# Patient Record
Sex: Female | Born: 1970
Health system: Southern US, Community
[De-identification: ages and names within clinical notes are randomized; demographics above are authoritative.]

## PROBLEM LIST (undated history)

## (undated) DIAGNOSIS — M62838 Other muscle spasm: Secondary | ICD-10-CM

## (undated) DIAGNOSIS — R479 Unspecified speech disturbances: Secondary | ICD-10-CM

## (undated) DIAGNOSIS — G4700 Insomnia, unspecified: Secondary | ICD-10-CM

## (undated) DIAGNOSIS — J45909 Unspecified asthma, uncomplicated: Secondary | ICD-10-CM

## (undated) DIAGNOSIS — I10 Essential (primary) hypertension: Secondary | ICD-10-CM

## (undated) DIAGNOSIS — R7303 Prediabetes: Secondary | ICD-10-CM

## (undated) DIAGNOSIS — F419 Anxiety disorder, unspecified: Secondary | ICD-10-CM

## (undated) HISTORY — DX: Other muscle spasm: M62.838

## (undated) HISTORY — DX: Insomnia, unspecified: G47.00

## (undated) HISTORY — DX: Prediabetes: R73.03

## (undated) HISTORY — DX: Unspecified speech disturbances: R47.9

## (undated) HISTORY — DX: Anxiety disorder, unspecified: F41.9

## (undated) HISTORY — PX: TUBAL LIGATION: SHX77

---

## 1998-02-08 ENCOUNTER — Emergency Department (HOSPITAL_COMMUNITY): Admission: EM | Admit: 1998-02-08 | Discharge: 1998-02-08 | Payer: Self-pay | Admitting: Emergency Medicine

## 1998-02-14 ENCOUNTER — Emergency Department (HOSPITAL_COMMUNITY): Admission: EM | Admit: 1998-02-14 | Discharge: 1998-02-14 | Payer: Self-pay | Admitting: Emergency Medicine

## 1998-02-14 ENCOUNTER — Encounter: Payer: Self-pay | Admitting: Emergency Medicine

## 2001-11-04 ENCOUNTER — Other Ambulatory Visit: Admission: RE | Admit: 2001-11-04 | Discharge: 2001-11-04 | Payer: Self-pay | Admitting: Family Medicine

## 2004-08-16 ENCOUNTER — Emergency Department (HOSPITAL_COMMUNITY): Admission: EM | Admit: 2004-08-16 | Discharge: 2004-08-16 | Payer: Self-pay | Admitting: *Deleted

## 2004-09-04 ENCOUNTER — Encounter: Admission: RE | Admit: 2004-09-04 | Discharge: 2004-09-27 | Payer: Self-pay | Admitting: Sports Medicine

## 2005-04-07 ENCOUNTER — Emergency Department (HOSPITAL_COMMUNITY): Admission: EM | Admit: 2005-04-07 | Discharge: 2005-04-08 | Payer: Self-pay | Admitting: Emergency Medicine

## 2008-08-28 ENCOUNTER — Emergency Department (HOSPITAL_COMMUNITY): Admission: EM | Admit: 2008-08-28 | Discharge: 2008-08-28 | Payer: Self-pay | Admitting: Emergency Medicine

## 2009-02-03 ENCOUNTER — Emergency Department (HOSPITAL_COMMUNITY): Admission: EM | Admit: 2009-02-03 | Discharge: 2009-02-03 | Payer: Self-pay | Admitting: Emergency Medicine

## 2010-07-27 LAB — PREGNANCY, URINE: Preg Test, Ur: NEGATIVE

## 2010-12-20 ENCOUNTER — Emergency Department (HOSPITAL_COMMUNITY)
Admission: EM | Admit: 2010-12-20 | Discharge: 2010-12-21 | Disposition: A | Discharge status: Home / Self Care | Payer: Self-pay | Attending: Emergency Medicine | Admitting: Emergency Medicine

## 2010-12-20 DIAGNOSIS — R51 Headache: Secondary | ICD-10-CM | POA: Insufficient documentation

## 2010-12-20 DIAGNOSIS — F411 Generalized anxiety disorder: Secondary | ICD-10-CM | POA: Insufficient documentation

## 2010-12-20 DIAGNOSIS — R42 Dizziness and giddiness: Secondary | ICD-10-CM | POA: Insufficient documentation

## 2010-12-20 LAB — URINALYSIS, ROUTINE W REFLEX MICROSCOPIC
Glucose, UA: NEGATIVE mg/dL
Hgb urine dipstick: NEGATIVE
Ketones, ur: NEGATIVE mg/dL
Nitrite: NEGATIVE
Specific Gravity, Urine: 1.033 — ABNORMAL HIGH (ref 1.005–1.030)
pH: 6.5 (ref 5.0–8.0)

## 2010-12-20 LAB — POCT PREGNANCY, URINE: Preg Test, Ur: NEGATIVE

## 2011-11-07 ENCOUNTER — Encounter (HOSPITAL_COMMUNITY): Payer: Self-pay | Admitting: *Deleted

## 2011-11-07 ENCOUNTER — Emergency Department (HOSPITAL_COMMUNITY)
Admission: EM | Admit: 2011-11-07 | Discharge: 2011-11-07 | Discharge status: Against Medical Advice | Payer: Self-pay | Attending: Emergency Medicine | Admitting: Emergency Medicine

## 2011-11-07 DIAGNOSIS — B353 Tinea pedis: Secondary | ICD-10-CM

## 2011-11-07 DIAGNOSIS — M79609 Pain in unspecified limb: Secondary | ICD-10-CM | POA: Insufficient documentation

## 2011-11-07 HISTORY — DX: Unspecified asthma, uncomplicated: J45.909

## 2011-11-07 NOTE — ED Notes (Signed)
Reports possible spider bite to right foot, between 4th and 5th toes. No acute distress noted at triage.

## 2011-11-07 NOTE — ED Provider Notes (Signed)
History   This chart was scribed for Rebecca Shi, MD by Sofie Rower. The patient was seen in room TR02C/TR02C and the patient's care was started at 4:31 PM       CSN: [id number]  Arrival date & time 11/07/11  1603   First MD Initiated Contact with Patient 11/07/11 1618      Chief Complaint  Patient presents with  . Foot Injury    (Consider location/radiation/quality/duration/timing/severity/associated sxs/prior treatment) Patient is a 41 y.o. female presenting with foot injury. The history is provided by the patient. No language interpreter was used.  Foot Injury  The incident occurred 3 to 5 hours ago. The incident occurred at home. The injury mechanism is unknown. The pain is present in the right foot. The quality of the pain is described as aching. The pain is mild. The pain has been constant since onset. Pertinent negatives include no numbness, no inability to bear weight and no loss of sensation. She reports no foreign bodies present. Nothing aggravates the symptoms. Treatments tried: allergy medication.  The treatment provided no relief.    Rebecca Stevenson is a 41 y.o. female who presents to the Emergency Department complaining of moderate, episodic foot injury located at the right foot between the 4th and 5th toes onset today with associated symptoms of swelling, soreness, restlessness, dizziness, abdominal cramping. Modifying factors include taking over the counter allergy medication which does not provide relief, soaking feet in epsom salt which provides moderate relief. Pt has a hx of asthma.   Pt denies itching, diabetes, anemia, chance of pregnancy.   PCP is Dr. Daphine Deutscher.    Past Medical History  Diagnosis Date  . Asthma     History reviewed. No pertinent past surgical history.  History reviewed. No pertinent family history.  History  Substance Use Topics  . Smoking status: Not on file  . Smokeless tobacco: Not on file  . Alcohol Use: No    OB History      Grav Para Term Preterm Abortions TAB SAB Ect Mult Living                  Review of Systems  Neurological: Negative for numbness.  All other systems reviewed and are negative.    10 Systems reviewed and all are negative for acute change except as noted in the HPI.    Allergies  Review of patient's allergies indicates no known allergies.  Home Medications   Current Outpatient Rx  Name Route Sig Dispense Refill  . ALBUTEROL SULFATE HFA 108 (90 BASE) MCG/ACT IN AERS Inhalation Inhale 2 puffs into the lungs every 6 (six) hours as needed. For asthma symptoms    . DIPHENHYDRAMINE HCL 25 MG PO CAPS Oral Take 25 mg by mouth daily as needed. For allergies      BP 153/91  Pulse 73  Temp 98.7 F (37.1 C) (Oral)  Resp 16  SpO2 100%  LMP 11/07/2011  Physical Exam  Nursing note and vitals reviewed. Constitutional: She is oriented to person, place, and time. She appears well-developed and well-nourished. No distress.  HENT:  Head: Normocephalic and atraumatic.  Nose: Nose normal.  Eyes: Pupils are equal, round, and reactive to light.  Neck: Normal range of motion.  Cardiovascular: Normal rate and intact distal pulses.         Date: 11/07/2011  Rate: 70  Rhythm: normal sinus rhythm  QRS Axis: normal  Intervals: normal  ST/T Wave abnormalities: normal  Conduction Disutrbances: none  Narrative Interpretation: unremarkable      Pulmonary/Chest: No respiratory distress.  Abdominal: Normal appearance. She exhibits no distension.  Musculoskeletal: Normal range of motion.       Feet:  Neurological: She is alert and oriented to person, place, and time. No cranial nerve deficit.  Skin: Skin is warm and dry. No rash noted.  Psychiatric: She has a normal mood and affect. Her behavior is normal.    ED Course  Procedures (including critical care time)  DIAGNOSTIC STUDIES: Oxygen Saturation is 100% on room air, normal by my interpretation.    COORDINATION OF  CARE:    4:35PM- EDP at bedside discusses treatment plan concerning breaking of the skin, creation of bacterial infection. EDP discusses application of fungal medication, antibiotics, and dressing the fungal area for 2-3 weeks.   4:40PM- Patient inquires with EDP about possible examination of her dizziness. EDP discusses with pt the possibility of blood work and pregnancy test.   Labs Reviewed - No data to display No results found.   1. Athlete's foot       MDM         I personally performed the services described in this documentation, which was scribed in my presence. The recorded information has been reviewed and considered.    Rebecca Shi, MD 11/14/11 2118

## 2011-11-07 NOTE — ED Notes (Signed)
In to assess; unable to locate pt - positive gown sign - MD aware of same

## 2011-12-05 ENCOUNTER — Emergency Department (HOSPITAL_COMMUNITY): Payer: Self-pay

## 2011-12-05 ENCOUNTER — Encounter (HOSPITAL_COMMUNITY): Payer: Self-pay | Admitting: Emergency Medicine

## 2011-12-05 ENCOUNTER — Emergency Department (HOSPITAL_COMMUNITY)
Admission: EM | Admit: 2011-12-05 | Discharge: 2011-12-05 | Disposition: A | Discharge status: Home / Self Care | Payer: No Typology Code available for payment source | Attending: Emergency Medicine | Admitting: Emergency Medicine

## 2011-12-05 DIAGNOSIS — M25559 Pain in unspecified hip: Secondary | ICD-10-CM

## 2011-12-05 DIAGNOSIS — Z87891 Personal history of nicotine dependence: Secondary | ICD-10-CM | POA: Insufficient documentation

## 2011-12-05 DIAGNOSIS — Y998 Other external cause status: Secondary | ICD-10-CM | POA: Insufficient documentation

## 2011-12-05 DIAGNOSIS — J45909 Unspecified asthma, uncomplicated: Secondary | ICD-10-CM | POA: Insufficient documentation

## 2011-12-05 DIAGNOSIS — S39012A Strain of muscle, fascia and tendon of lower back, initial encounter: Secondary | ICD-10-CM

## 2011-12-05 DIAGNOSIS — S161XXA Strain of muscle, fascia and tendon at neck level, initial encounter: Secondary | ICD-10-CM

## 2011-12-05 DIAGNOSIS — S139XXA Sprain of joints and ligaments of unspecified parts of neck, initial encounter: Secondary | ICD-10-CM | POA: Insufficient documentation

## 2011-12-05 DIAGNOSIS — S339XXA Sprain of unspecified parts of lumbar spine and pelvis, initial encounter: Secondary | ICD-10-CM | POA: Insufficient documentation

## 2011-12-05 DIAGNOSIS — Y93I9 Activity, other involving external motion: Secondary | ICD-10-CM | POA: Insufficient documentation

## 2011-12-05 LAB — URINALYSIS, ROUTINE W REFLEX MICROSCOPIC
Bilirubin Urine: NEGATIVE
Ketones, ur: NEGATIVE mg/dL
Leukocytes, UA: NEGATIVE
Nitrite: NEGATIVE
Protein, ur: NEGATIVE mg/dL
Specific Gravity, Urine: 1.009 (ref 1.005–1.030)

## 2011-12-05 LAB — URINE MICROSCOPIC-ADD ON

## 2011-12-05 LAB — PREGNANCY, URINE: Preg Test, Ur: NEGATIVE

## 2011-12-05 MED ORDER — OXYCODONE-ACETAMINOPHEN 5-325 MG PO TABS
1.0000 | ORAL_TABLET | Freq: Once | ORAL | Status: AC
  Administered 2011-12-05: 1 via ORAL
  Filled 2011-12-05: qty 1

## 2011-12-05 MED ORDER — IBUPROFEN 600 MG PO TABS: 600.0000 mg | ORAL_TABLET | Freq: Four times a day (QID) | ORAL | Status: AC | PRN
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
## 2012-08-25 ENCOUNTER — Encounter (HOSPITAL_COMMUNITY): Payer: Self-pay

## 2012-08-25 ENCOUNTER — Emergency Department (INDEPENDENT_AMBULATORY_CARE_PROVIDER_SITE_OTHER)
Admission: EM | Admit: 2012-08-25 | Discharge: 2012-08-25 | Disposition: A | Discharge status: Home / Self Care | Payer: Self-pay | Source: Home / Self Care | Attending: Emergency Medicine | Admitting: Emergency Medicine

## 2012-08-25 DIAGNOSIS — B86 Scabies: Secondary | ICD-10-CM

## 2012-08-25 DIAGNOSIS — J45909 Unspecified asthma, uncomplicated: Secondary | ICD-10-CM

## 2012-08-25 DIAGNOSIS — K529 Noninfective gastroenteritis and colitis, unspecified: Secondary | ICD-10-CM

## 2012-08-25 DIAGNOSIS — K5289 Other specified noninfective gastroenteritis and colitis: Secondary | ICD-10-CM

## 2012-08-25 LAB — POCT PREGNANCY, URINE: Preg Test, Ur: NEGATIVE

## 2012-08-25 MED ORDER — ONDANSETRON 8 MG PO TBDP: 8.0000 mg | ORAL_TABLET | Freq: Three times a day (TID) | ORAL | Status: DC | PRN

## 2012-08-25 MED ORDER — ONDANSETRON 4 MG PO TBDP
ORAL_TABLET | ORAL | Status: AC
  Filled 2012-08-25: qty 2

## 2012-08-25 MED ORDER — ALBUTEROL SULFATE HFA 108 (90 BASE) MCG/ACT IN AERS: 1.0000 | INHALATION_SPRAY | Freq: Four times a day (QID) | RESPIRATORY_TRACT | Status: DC | PRN

## 2012-08-25 MED ORDER — ONDANSETRON 4 MG PO TBDP
8.0000 mg | ORAL_TABLET | Freq: Once | ORAL | Status: AC
  Administered 2012-08-25: 8 mg via ORAL

## 2012-08-25 MED ORDER — IVERMECTIN 3 MG PO TABS: ORAL_TABLET | ORAL | Status: DC

## 2012-08-25 MED ORDER — DIPHENOXYLATE-ATROPINE 2.5-0.025 MG PO TABS: 1.0000 | ORAL_TABLET | Freq: Four times a day (QID) | ORAL | Status: DC | PRN

## 2012-08-25 NOTE — ED Notes (Signed)
Nausea vomiting, diarrhea, chills since yesterday

## 2012-08-25 NOTE — ED Provider Notes (Signed)
Chief Complaint:   Chief Complaint  Patient presents with  . GI Problem    History of Present Illness:   Rebecca Stevenson is a 42 year old female who has a history since last night of nausea, vomiting, and diarrhea. She's vomited about 6 times. There was a small speck of blood in the last vomitus. She's had about 3 diarrheal stools without blood or melena. She's been exposed to coworkers at work with similar symptoms. She had low-grade fever of 99.2 and felt chilled. She had some mild, periumbilical abdominal pain and cramping. No foreign travel or suspicious ingestions.  She also wanted something for treatment for scabies. She prefers a pill. She's been exposed to her daughter. Her daughter has already been treated she's had a few small itchy bumps on her arms.  She also wanted a refill on albuterol inhaler for asthma.  She also has thickened, mycotic nails.  Review of Systems:  Other than noted above, the patient denies any of the following symptoms: Systemic:  No fevers, chills, sweats, weight loss or gain, fatigue, or tiredness. ENT:  No nasal congestion, rhinorrhea, or sore throat. Lungs:  No cough, wheezing, or shortness of breath. Cardiac:  No chest pain, syncope, or presyncope. GI:  No abdominal pain, nausea, vomiting, anorexia, diarrhea, constipation, blood in stool or vomitus. GU:  No dysuria, frequency, or urgency.  PMFSH:  Past medical history, family history, social history, meds, and allergies were reviewed.  She has asthma but does not have an inhaler.   Physical Exam:   Vital signs:  BP 109/72  Pulse 70  Temp(Src) 98.5 F (36.9 C) (Oral)  Resp 20  SpO2 99% General:  Alert and oriented.  In no distress.  Skin warm and dry.  Good skin turgor, brisk capillary refill. ENT:  No scleral icterus, moist mucous membranes, no oral lesions, pharynx clear. Lungs:  Breath sounds clear and equal bilaterally.  No wheezes, rales, or rhonchi. Heart:  Rhythm regular, without  extrasystoles.  No gallops or murmers. Abdomen:   Soft and nontender, no organomegaly or mass, bowel sounds are hyperactive.  Skin: Clear, warm, and dry.  Good turgor.  Brisk capillary refill.  Labs:   Results for orders placed during the hospital encounter of 08/25/12  POCT PREGNANCY, URINE      Result Value Range   Preg Test, Ur NEGATIVE  NEGATIVE     Course in Urgent Care Center:    Given Zofran ODT 8 mg sublingually.   Assessment:  The primary encounter diagnosis was Gastroenteritis. Diagnoses of Scabies and Asthma were also pertinent to this visit.  Plan:   1.  The following meds were prescribed:   Discharge Medication List as of 08/25/2012  2:37 PM    START taking these medications   Details  !! albuterol (PROVENTIL HFA;VENTOLIN HFA) 108 (90 BASE) MCG/ACT inhaler Inhale 1-2 puffs into the lungs every 6 (six) hours as needed for wheezing., Starting 08/25/2012, Until Discontinued, Normal    diphenoxylate-atropine (LOMOTIL) 2.5-0.025 MG per tablet Take 1 tablet by mouth 4 (four) times daily as needed for diarrhea or loose stools., Starting 08/25/2012, Until Discontinued, Print    ivermectin (STROMECTOL) 3 MG TABS Take 7 pills all at once., Normal    ondansetron (ZOFRAN ODT) 8 MG disintegrating tablet Take 1 tablet (8 mg total) by mouth every 8 (eight) hours as needed for nausea., Starting 08/25/2012, Until Discontinued, Normal     !! - Potential duplicate medications found. Please discuss with provider.  2.  The patient was instructed in symptomatic care and handouts were given. 3.  The patient was told to return if becoming worse in any way, if no better in 2 or 3 days, and given some red flag symptoms such as  persistent vomiting, pain, or fever  that would indicate earlier return. 4.  The patient was told to take only sips of clear liquids for the next 24 hours and then advance to a b.r.a.t. Diet. 5.  Follow up here if no better in 2 to 3 days.      Reuben Likes,  MD 08/25/12 2154
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
## 2013-09-26 ENCOUNTER — Encounter (HOSPITAL_COMMUNITY): Payer: Self-pay | Admitting: Emergency Medicine

## 2013-09-26 ENCOUNTER — Emergency Department (HOSPITAL_COMMUNITY)
Admission: EM | Admit: 2013-09-26 | Discharge: 2013-09-26 | Disposition: A | Discharge status: Home / Self Care | Payer: Self-pay | Source: Home / Self Care | Attending: Family Medicine | Admitting: Family Medicine

## 2013-09-26 DIAGNOSIS — S43499A Other sprain of unspecified shoulder joint, initial encounter: Secondary | ICD-10-CM

## 2013-09-26 DIAGNOSIS — S46819A Strain of other muscles, fascia and tendons at shoulder and upper arm level, unspecified arm, initial encounter: Secondary | ICD-10-CM

## 2013-09-26 DIAGNOSIS — X500XXA Overexertion from strenuous movement or load, initial encounter: Secondary | ICD-10-CM

## 2013-09-26 DIAGNOSIS — X503XXA Overexertion from repetitive movements, initial encounter: Secondary | ICD-10-CM

## 2013-09-26 MED ORDER — HYDROCODONE-ACETAMINOPHEN 5-325 MG PO TABS: 1.0000 | ORAL_TABLET | ORAL | Status: DC | PRN

## 2013-09-26 MED ORDER — CYCLOBENZAPRINE HCL 10 MG PO TABS: 10.0000 mg | ORAL_TABLET | Freq: Two times a day (BID) | ORAL | Status: DC | PRN

## 2013-09-26 MED ORDER — IBUPROFEN 800 MG PO TABS
ORAL_TABLET | ORAL | Status: AC
  Filled 2013-09-26: qty 1

## 2013-09-26 MED ORDER — TRAMADOL HCL 50 MG PO TABS: 50.0000 mg | ORAL_TABLET | Freq: Four times a day (QID) | ORAL | Status: DC | PRN

## 2013-09-26 MED ORDER — IBUPROFEN 800 MG PO TABS
800.0000 mg | ORAL_TABLET | Freq: Once | ORAL | Status: AC
  Administered 2013-09-26: 800 mg via ORAL

## 2013-09-26 NOTE — ED Provider Notes (Signed)
Medical screening examination/treatment/procedure(s) were performed by a resident physician or non-physician practitioner and as the supervising physician I was immediately available for consultation/collaboration.  Clementeen Graham, MD   Rodolph Bong, MD 09/26/13 [phone]

## 2013-09-26 NOTE — ED Provider Notes (Signed)
CSN: [id number]     Arrival date & time 09/26/13  1732 History   First MD Initiated Contact with Patient 09/26/13 1819     Chief Complaint  Patient presents with  . Back Pain   Patient is a 43 y.o. female presenting with back pain. The history is provided by the patient.  Back Pain Quality:  Aching and burning Pain severity:  Moderate Onset quality:  Gradual Duration:  2 days Timing:  Constant Progression:  Worsening Chronicity:  New Context: lifting heavy objects   Ineffective treatments:  OTC medications Pt reports onset of upper (R) sided back pain Friday after lifting heavy boxes on Thursday. States she worked Friday and the repetitive movements of her arms required in her job aggravated the pain. She was unable to work today. Pain is like an ache but also sharp at times. Feels like she is having muscle spasms at times but pain is constant. Denies weakness of eother UE and denies numbness or tingling. Tylenol has provided no relief.  Past Medical History  Diagnosis Date  . Asthma    History reviewed. No pertinent past surgical history. No family history on file. History  Substance Use Topics  . Smoking status: Former Games developer  . Smokeless tobacco: Not on file  . Alcohol Use: No   OB History   Grav Para Term Preterm Abortions TAB SAB Ect Mult Living                 Review of Systems  Musculoskeletal: Positive for back pain.  All other systems reviewed and are negative.   Allergies  Review of patient's allergies indicates no known allergies.  Home Medications   Prior to Admission medications   Medication Sig Start Date End Date Taking? Authorizing Provider  albuterol (PROVENTIL HFA;VENTOLIN HFA) 108 (90 BASE) MCG/ACT inhaler Inhale 2 puffs into the lungs every 6 (six) hours as needed. For asthma symptoms   Yes Historical Provider, MD  albuterol (PROVENTIL HFA;VENTOLIN HFA) 108 (90 BASE) MCG/ACT inhaler Inhale 1-2 puffs into the lungs every 6 (six) hours as needed for  wheezing. 08/25/12  Yes Reuben Likes, MD  cyclobenzaprine (FLEXERIL) 10 MG tablet Take 1 tablet (10 mg total) by mouth 2 (two) times daily as needed for muscle spasms. 09/26/13   Roma Kayser Averey Trompeter, NP  diphenhydrAMINE (BENADRYL) 25 mg capsule Take 25 mg by mouth daily as needed. For allergies    Historical Provider, MD  diphenoxylate-atropine (LOMOTIL) 2.5-0.025 MG per tablet Take 1 tablet by mouth 4 (four) times daily as needed for diarrhea or loose stools. 08/25/12   Reuben Likes, MD  HYDROcodone-acetaminophen (NORCO/VICODIN) 5-325 MG per tablet Take 1 tablet by mouth every 4 (four) hours as needed. 09/26/13   Roma Kayser Opaline Reyburn, NP  ivermectin (STROMECTOL) 3 MG TABS Take 7 pills all at once. 08/25/12   Reuben Likes, MD  ondansetron (ZOFRAN ODT) 8 MG disintegrating tablet Take 1 tablet (8 mg total) by mouth every 8 (eight) hours as needed for nausea. 08/25/12   Reuben Likes, MD  traMADol (ULTRAM) 50 MG tablet Take 1 tablet (50 mg total) by mouth every 6 (six) hours as needed. 09/26/13   Roma Kayser Ferne Ellingwood, NP   BP 122/85  Pulse 78  Temp(Src) 98.7 F (37.1 C) (Oral)  Resp 16  SpO2 100% Physical Exam  Constitutional: She is oriented to person, place, and time. She appears well-developed and well-nourished.  HENT:  Head: Atraumatic.  Eyes: Conjunctivae are normal.  Neck:  Neck supple.  Cardiovascular: Normal rate.   Musculoskeletal:       Arms: Neurological: She is alert and oriented to person, place, and time.  Skin: Skin is warm and dry.  Psychiatric: She has a normal mood and affect.    ED Course  Procedures (including critical care time) Labs Review Labs Reviewed - No data to display  Imaging Review No results found.   MDM   1. Trapezius muscle strain    S/p heavy lifting, aggravated by repetitive UE movements on her job.  Rest 24-48 hrs, NSAIDS TID x 5 days, Flexeril BID PRN, Vicodin PRN and Tramadol for pain PRN at work if needed.  Ortho f/u if no  improvement.    Leanne ChangKatherine P Aislynn Cifelli, NP 09/26/13 1913

## 2013-09-26 NOTE — Discharge Instructions (Signed)
The likely source of your pain is a strain to your Trapezius muscle. Rest over the next 24-48 hours and avoid heavy lifting or strenuous activity. A heating pad will provide relief. Start Ibuprofen 800 mg three times a day for 5 days then just as needed for pain. Take the other medications as directed. When you return to work you can take the Tramadol for pain if needed. If your pain does not gradually improve or worsens you will need to arrange follow up with the orthopedist for further evaluation.   Muscle Strain A muscle strain (pulled muscle) happens when a muscle is stretched beyond normal length. It happens when a sudden, violent force stretches your muscle too far. Usually, a few of the fibers in your muscle are torn. Muscle strain is common in athletes. Recovery usually takes 1 2 weeks. Complete healing takes 5 6 weeks.  HOME CARE   Follow the PRICE method of treatment to help your injury get better. Do this the first 2 3 days after the injury:  Protect. Protect the muscle to keep it from getting injured again.  Rest. Limit your activity and rest the injured body part.  Ice. Put ice in a plastic bag. Place a towel between your skin and the bag. Then, apply the ice and leave it on from 15 20 minutes each hour. After the third day, switch to moist heat packs.  Compression. Use a splint or elastic bandage on the injured area for comfort. Do not put it on too tightly.  Elevate. Keep the injured body part above the level of your heart.  Only take medicine as told by your doctor.  Warm up before doing exercise to prevent future muscle strains. GET HELP IF:   You have more pain or puffiness (swelling) in the injured area.  You feel numbness, tingling, or notice a loss of strength in the injured area. MAKE SURE YOU:   Understand these instructions.  Will watch your condition.  Will get help right away if you are not doing well or get worse. Document Released: 01/17/2008 Document  Revised: 01/28/2013 Document Reviewed: 11/06/2012 [facility] Patient Information 2014 [place], [place].

## 2013-09-26 NOTE — ED Notes (Signed)
Patient reports back pain from mid to upper back, onset 3 days ago when moving boxes at home.  Patient comments about pain being "aggravated" by the movement of arms at or above shoulder height.  Patient also commented about calling in to work today.  Work involves similar motions and was unable to go to work today.

## 2013-11-27 ENCOUNTER — Emergency Department (INDEPENDENT_AMBULATORY_CARE_PROVIDER_SITE_OTHER)
Admission: EM | Admit: 2013-11-27 | Discharge: 2013-11-27 | Disposition: A | Discharge status: Home / Self Care | Payer: Self-pay | Source: Home / Self Care

## 2013-11-27 ENCOUNTER — Other Ambulatory Visit (HOSPITAL_COMMUNITY)
Admission: RE | Admit: 2013-11-27 | Discharge: 2013-11-27 | Disposition: A | Discharge status: Home / Self Care | Payer: Self-pay | Source: Ambulatory Visit | Attending: Family Medicine | Admitting: Family Medicine

## 2013-11-27 ENCOUNTER — Encounter (HOSPITAL_COMMUNITY): Payer: Self-pay | Admitting: Emergency Medicine

## 2013-11-27 DIAGNOSIS — Z202 Contact with and (suspected) exposure to infections with a predominantly sexual mode of transmission: Secondary | ICD-10-CM

## 2013-11-27 DIAGNOSIS — Z113 Encounter for screening for infections with a predominantly sexual mode of transmission: Secondary | ICD-10-CM | POA: Insufficient documentation

## 2013-11-27 DIAGNOSIS — N760 Acute vaginitis: Secondary | ICD-10-CM | POA: Insufficient documentation

## 2013-11-27 LAB — POCT URINALYSIS DIP (DEVICE)
BILIRUBIN URINE: NEGATIVE
Glucose, UA: NEGATIVE mg/dL
HGB URINE DIPSTICK: NEGATIVE
KETONES UR: NEGATIVE mg/dL
Leukocytes, UA: NEGATIVE
Nitrite: NEGATIVE
PH: 7 (ref 5.0–8.0)
PROTEIN: NEGATIVE mg/dL
Specific Gravity, Urine: 1.015 (ref 1.005–1.030)
Urobilinogen, UA: 0.2 mg/dL (ref 0.0–1.0)

## 2013-11-27 LAB — POCT PREGNANCY, URINE: Preg Test, Ur: NEGATIVE

## 2013-11-27 MED ORDER — AZITHROMYCIN 250 MG PO TABS
ORAL_TABLET | ORAL | Status: AC
  Filled 2013-11-27: qty 1

## 2013-11-27 MED ORDER — AZITHROMYCIN 250 MG PO TABS
1000.0000 mg | ORAL_TABLET | Freq: Once | ORAL | Status: AC
  Administered 2013-11-27: 1000 mg via ORAL

## 2013-11-27 NOTE — ED Notes (Signed)
Call back number for lab issues verified at [email or web address]
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
## 2013-11-27 NOTE — ED Provider Notes (Signed)
CSN: [id number]     Arrival date & time 11/27/13  8657 History   None    Chief Complaint  Patient presents with  . SEXUALLY TRANSMITTED DISEASE   (Consider location/radiation/quality/duration/timing/severity/associated sxs/prior Treatment) HPI Vaginal discharge. Ongoing for more than 1 wk. Slight odor. Greenish in appearance. Minimal vaginal discomfort. Getting worse. Pt sexually active w/ female partner who was called by his previous sexual contact and told she has chlamydia. Denies fevers, Lower abd pain, dysuria, frequency.    Past Medical History  Diagnosis Date  . Asthma    History reviewed. No pertinent past surgical history. History reviewed. No pertinent family history. History  Substance Use Topics  . Smoking status: Former Games developer  . Smokeless tobacco: Not on file  . Alcohol Use: No   OB History   Grav Para Term Preterm Abortions TAB SAB Ect Mult Living                 Review of Systems Per HPI with all other pertinent systems negative.   Allergies  Review of patient's allergies indicates no known allergies.  Home Medications   Prior to Admission medications   Medication Sig Start Date End Date Taking? Authorizing Provider  albuterol (PROVENTIL HFA;VENTOLIN HFA) 108 (90 BASE) MCG/ACT inhaler Inhale 2 puffs into the lungs every 6 (six) hours as needed. For asthma symptoms    Historical Provider, MD  albuterol (PROVENTIL HFA;VENTOLIN HFA) 108 (90 BASE) MCG/ACT inhaler Inhale 1-2 puffs into the lungs every 6 (six) hours as needed for wheezing. 08/25/12   Reuben Likes, MD  cyclobenzaprine (FLEXERIL) 10 MG tablet Take 1 tablet (10 mg total) by mouth 2 (two) times daily as needed for muscle spasms. 09/26/13   Roma Kayser Schorr, NP  diphenhydrAMINE (BENADRYL) 25 mg capsule Take 25 mg by mouth daily as needed. For allergies    Historical Provider, MD  diphenoxylate-atropine (LOMOTIL) 2.5-0.025 MG per tablet Take 1 tablet by mouth 4 (four) times daily as needed for diarrhea or  loose stools. 08/25/12   Reuben Likes, MD  HYDROcodone-acetaminophen (NORCO/VICODIN) 5-325 MG per tablet Take 1 tablet by mouth every 4 (four) hours as needed. 09/26/13   Roma Kayser Schorr, NP  ivermectin (STROMECTOL) 3 MG TABS Take 7 pills all at once. 08/25/12   Reuben Likes, MD  ondansetron (ZOFRAN ODT) 8 MG disintegrating tablet Take 1 tablet (8 mg total) by mouth every 8 (eight) hours as needed for nausea. 08/25/12   Reuben Likes, MD  traMADol (ULTRAM) 50 MG tablet Take 1 tablet (50 mg total) by mouth every 6 (six) hours as needed. 09/26/13   Roma Kayser Schorr, NP   BP 156/82  Pulse 76  Temp(Src) 98.4 F (36.9 C)  Resp 16  SpO2 97% Physical Exam  Constitutional: She appears well-developed and well-nourished. No distress.  HENT:  Head: Normocephalic and atraumatic.  Eyes: EOM are normal. Pupils are equal, round, and reactive to light.  Neck: Normal range of motion.  Cardiovascular: Normal rate, normal heart sounds and intact distal pulses.   No murmur heard. Pulmonary/Chest: Effort normal and breath sounds normal.  Abdominal: Soft. Bowel sounds are normal.  Genitourinary: Uterus normal. Vaginal discharge found.  Musculoskeletal: Normal range of motion. She exhibits no edema and no tenderness.  Neurological: She is alert. She exhibits normal muscle tone.  Skin: Skin is warm. No rash noted. She is not diaphoretic.  Psychiatric: She has a normal mood and affect. Her behavior is normal. Judgment and thought content  normal.    ED Course  Procedures (including critical care time) Labs Review Labs Reviewed  POCT URINALYSIS DIP (DEVICE)  CERVICOVAGINAL ANCILLARY ONLY    Imaging Review No results found.   MDM   1. Exposure to chlamydia   2. Vaginitis    Azithro 1gm in office Wet prep, GC Chl sent HIV and RPR recently at health dept Precautions given and all questions answered   Shelly Flattenavid Merrell, MD Family Medicine 11/27/2013, 10:16 AM      Ozella Rocksavid J Merrell, MD 11/27/13  1016

## 2013-11-27 NOTE — Discharge Instructions (Signed)
You have been properly treated for a possible chlamydia infection.  We will call you with any other lab results that are positive

## 2013-12-01 NOTE — ED Notes (Addendum)
GC neg., Chlamydia pos., Affirm: all neg. No treatment noted. Message to Dr. Denyse Amassorey. Vassie MoselleYork, Rebecca Stevenson 12/01/2013 Error previous note. Pt. of Dr. Konrad DoloresMerrell and pt was treated with Zithromax on 8/7.  Message to Dr. Denyse Amassorey retracted. 12/03/2013

## 2013-12-03 ENCOUNTER — Telehealth (HOSPITAL_COMMUNITY): Payer: Self-pay | Admitting: *Deleted

## 2013-12-03 NOTE — ED Notes (Signed)
Pt. adequately treated with Zithromax on 8/7.  I called pt. and left a message to call.  Call 1.

## 2013-12-04 NOTE — ED Notes (Signed)
Pt. called back.  Pt. verified x 2 and given results.  Pt. Told she was adequately treated with the Zithromax for Chlamydia.  Pt. instructed to notify her partner, no sex for 1 week and to practice safe sex. [facility] by apppointment. Vassie MoselleYork, Kendrik Mcshan M 12/04/2013

## 2013-12-04 NOTE — ED Notes (Signed)
[facility] form completed and faxed to the [facility]. Lexey Fletes M 12/04/2013  

## 2014-01-25 ENCOUNTER — Encounter (HOSPITAL_COMMUNITY): Payer: Self-pay | Admitting: Emergency Medicine

## 2014-01-25 ENCOUNTER — Emergency Department (INDEPENDENT_AMBULATORY_CARE_PROVIDER_SITE_OTHER)
Admission: EM | Admit: 2014-01-25 | Discharge: 2014-01-25 | Disposition: A | Discharge status: Home / Self Care | Payer: Self-pay | Source: Home / Self Care | Attending: Family Medicine | Admitting: Family Medicine

## 2014-01-25 DIAGNOSIS — J069 Acute upper respiratory infection, unspecified: Secondary | ICD-10-CM

## 2014-01-25 MED ORDER — ALBUTEROL SULFATE HFA 108 (90 BASE) MCG/ACT IN AERS: 1.0000 | INHALATION_SPRAY | Freq: Four times a day (QID) | RESPIRATORY_TRACT | Status: DC | PRN

## 2014-01-25 MED ORDER — IPRATROPIUM BROMIDE 0.06 % NA SOLN: 2.0000 | Freq: Four times a day (QID) | NASAL | Status: DC

## 2014-01-25 MED ORDER — BENZONATATE 100 MG PO CAPS: 100.0000 mg | ORAL_CAPSULE | Freq: Three times a day (TID) | ORAL | Status: DC | PRN

## 2014-01-25 NOTE — Discharge Instructions (Signed)
Upper Respiratory Infection, Adult An upper respiratory infection (URI) is also sometimes known as the common cold. The upper respiratory tract includes the nose, sinuses, throat, trachea, and bronchi. Bronchi are the airways leading to the lungs. Most people improve within 1 week, but symptoms can last up to 2 weeks. A residual cough may last even longer.  CAUSES Many different viruses can infect the tissues lining the upper respiratory tract. The tissues become irritated and inflamed and often become very moist. Mucus production is also common. A cold is contagious. You can easily spread the virus to others by oral contact. This includes kissing, sharing a glass, coughing, or sneezing. Touching your mouth or nose and then touching a surface, which is then touched by another person, can also spread the virus. SYMPTOMS  Symptoms typically develop 1 to 3 days after you come in contact with a cold virus. Symptoms vary from person to person. They may include:  Runny nose.  Sneezing.  Nasal congestion.  Sinus irritation.  Sore throat.  Loss of voice (laryngitis).  Cough.  Fatigue.  Muscle aches.  Loss of appetite.  Headache.  Low-grade fever. DIAGNOSIS  You might diagnose your own cold based on familiar symptoms, since most people get a cold 2 to 3 times a year. Your caregiver can confirm this based on your exam. Most importantly, your caregiver can check that your symptoms are not due to another disease such as strep throat, sinusitis, pneumonia, asthma, or epiglottitis. Blood tests, throat tests, and X-rays are not necessary to diagnose a common cold, but they may sometimes be helpful in excluding other more serious diseases. Your caregiver will decide if any further tests are required. RISKS AND COMPLICATIONS  You may be at risk for a more severe case of the common cold if you smoke cigarettes, have chronic heart disease (such as heart failure) or lung disease (such as asthma), or if  you have a weakened immune system. The very young and very old are also at risk for more serious infections. Bacterial sinusitis, middle ear infections, and bacterial pneumonia can complicate the common cold. The common cold can worsen asthma and chronic obstructive pulmonary disease (COPD). Sometimes, these complications can require emergency medical care and may be life-threatening. PREVENTION  The best way to protect against getting a cold is to practice good hygiene. Avoid oral or hand contact with people with cold symptoms. Wash your hands often if contact occurs. There is no clear evidence that vitamin C, vitamin E, echinacea, or exercise reduces the chance of developing a cold. However, it is always recommended to get plenty of rest and practice good nutrition. TREATMENT  Treatment is directed at relieving symptoms. There is no cure. Antibiotics are not effective, because the infection is caused by a virus, not by bacteria. Treatment may include:  Increased fluid intake. Sports drinks offer valuable electrolytes, sugars, and fluids.  Breathing heated mist or steam (vaporizer or shower).  Eating chicken soup or other clear broths, and maintaining good nutrition.  Getting plenty of rest.  Using gargles or lozenges for comfort.  Controlling fevers with ibuprofen or acetaminophen as directed by your caregiver.  Increasing usage of your inhaler if you have asthma. Zinc gel and zinc lozenges, taken in the first 24 hours of the common cold, can shorten the duration and lessen the severity of symptoms. Pain medicines may help with fever, muscle aches, and throat pain. A variety of non-prescription medicines are available to treat congestion and runny nose. Your caregiver   can make recommendations and may suggest nasal or lung inhalers for other symptoms.  HOME CARE INSTRUCTIONS   Only take over-the-counter or prescription medicines for pain, discomfort, or fever as directed by your  caregiver.  Use a warm mist humidifier or inhale steam from a shower to increase air moisture. This may keep secretions moist and make it easier to breathe.  Drink enough water and fluids to keep your urine clear or pale yellow.  Rest as needed.  Return to work when your temperature has returned to normal or as your caregiver advises. You may need to stay home longer to avoid infecting others. You can also use a face mask and careful hand washing to prevent spread of the virus. SEEK MEDICAL CARE IF:   After the first few days, you feel you are getting worse rather than better.  You need your caregiver's advice about medicines to control symptoms.  You develop chills, worsening shortness of breath, or brown or red sputum. These may be signs of pneumonia.  You develop yellow or brown nasal discharge or pain in the face, especially when you bend forward. These may be signs of sinusitis.  You develop a fever, swollen neck glands, pain with swallowing, or white areas in the back of your throat. These may be signs of strep throat. SEEK IMMEDIATE MEDICAL CARE IF:   You have a fever.  You develop severe or persistent headache, ear pain, sinus pain, or chest pain.  You develop wheezing, a prolonged cough, cough up blood, or have a change in your usual mucus (if you have chronic lung disease).  You develop sore muscles or a stiff neck. Document Released: 10/03/2000 Document Revised: 07/02/2011 Document Reviewed: 07/15/2013 ExitCare Patient Information 2015 [employer]. This information is not intended to replace advice given to you by your health care provider. Make sure you discuss any questions you have with your health care provider.  

## 2014-01-25 NOTE — ED Provider Notes (Signed)
CSN: [id number]     Arrival date & time 01/25/14  1244 History   First MD Initiated Contact with Patient 01/25/14 1347     Chief Complaint  Patient presents with  . URI   (Consider location/radiation/quality/duration/timing/severity/associated sxs/prior Treatment) HPI Comments: PCP: none Former smoker Works at [employer]  Patient is a 43 y.o. female presenting with URI.  URI Presenting symptoms: congestion, cough, rhinorrhea and sore throat   Presenting symptoms: no ear pain, no facial pain, no fatigue and no fever   Severity:  Mild Onset quality:  Gradual Duration: several days. Progression:  Improving Chronicity:  New Associated symptoms: sneezing and wheezing   Associated symptoms: no arthralgias, no headaches, no myalgias, no neck pain, no sinus pain and no swollen glands     Past Medical History  Diagnosis Date  . Asthma    History reviewed. No pertinent past surgical history. History reviewed. No pertinent family history. History  Substance Use Topics  . Smoking status: Former Games developer  . Smokeless tobacco: Not on file  . Alcohol Use: No   OB History   Grav Para Term Preterm Abortions TAB SAB Ect Mult Living                 Review of Systems  Constitutional: Negative for fever and fatigue.  HENT: Positive for congestion, rhinorrhea, sneezing and sore throat. Negative for ear pain.   Respiratory: Positive for cough and wheezing.   Musculoskeletal: Negative for arthralgias, myalgias and neck pain.  Neurological: Negative for headaches.  All other systems reviewed and are negative.   Allergies  Review of patient's allergies indicates no known allergies.  Home Medications   Prior to Admission medications   Medication Sig Start Date End Date Taking? Authorizing Provider  albuterol (PROVENTIL HFA;VENTOLIN HFA) 108 (90 BASE) MCG/ACT inhaler Inhale 2 puffs into the lungs every 6 (six) hours as needed. For asthma symptoms    Historical Provider, MD  albuterol  (PROVENTIL HFA;VENTOLIN HFA) 108 (90 BASE) MCG/ACT inhaler Inhale 1-2 puffs into the lungs every 6 (six) hours as needed for wheezing. 08/25/12   Reuben Likes, MD  albuterol (PROVENTIL HFA;VENTOLIN HFA) 108 (90 BASE) MCG/ACT inhaler Inhale 1-2 puffs into the lungs every 6 (six) hours as needed for wheezing or shortness of breath. 01/25/14   Mathis Fare Camren Henthorn, PA  benzonatate (TESSALON) 100 MG capsule Take 1 capsule (100 mg total) by mouth 3 (three) times daily as needed for cough. 01/25/14   Mathis Fare Daemon Dowty, PA  cyclobenzaprine (FLEXERIL) 10 MG tablet Take 1 tablet (10 mg total) by mouth 2 (two) times daily as needed for muscle spasms. 09/26/13   Roma Kayser Schorr, NP  diphenhydrAMINE (BENADRYL) 25 mg capsule Take 25 mg by mouth daily as needed. For allergies    Historical Provider, MD  diphenoxylate-atropine (LOMOTIL) 2.5-0.025 MG per tablet Take 1 tablet by mouth 4 (four) times daily as needed for diarrhea or loose stools. 08/25/12   Reuben Likes, MD  HYDROcodone-acetaminophen (NORCO/VICODIN) 5-325 MG per tablet Take 1 tablet by mouth every 4 (four) hours as needed. 09/26/13   Roma Kayser Schorr, NP  ipratropium (ATROVENT) 0.06 % nasal spray Place 2 sprays into both nostrils 4 (four) times daily. As needed for nasal congestion 01/25/14   Ria Clock, PA  ivermectin (STROMECTOL) 3 MG TABS Take 7 pills all at once. 08/25/12   Reuben Likes, MD  ondansetron (ZOFRAN ODT) 8 MG disintegrating tablet Take 1 tablet (8 mg  total) by mouth every 8 (eight) hours as needed for nausea. 08/25/12   Reuben Likesavid C Keller, MD  traMADol (ULTRAM) 50 MG tablet Take 1 tablet (50 mg total) by mouth every 6 (six) hours as needed. 09/26/13   Roma KayserKatherine P Schorr, NP   BP 137/80  Pulse 85  Temp(Src) 98.9 F (37.2 C) (Oral)  Resp 20  SpO2 99% Physical Exam  Nursing note and vitals reviewed. Constitutional: She is oriented to person, place, and time. She appears well-developed and well-nourished.  +overweight  HENT:   Head: Normocephalic and atraumatic.  Right Ear: Hearing, tympanic membrane, external ear and ear canal normal.  Left Ear: Hearing, tympanic membrane, external ear and ear canal normal.  Nose: Nose normal.  Mouth/Throat: Uvula is midline, oropharynx is clear and moist and mucous membranes are normal.  Eyes: Conjunctivae are normal. No scleral icterus.  Neck: Normal range of motion. Neck supple.  Cardiovascular: Normal rate, regular rhythm and normal heart sounds.   Pulmonary/Chest: Effort normal and breath sounds normal. No respiratory distress. She has no wheezes.  Musculoskeletal: Normal range of motion.  Lymphadenopathy:    She has no cervical adenopathy.  Neurological: She is alert and oriented to person, place, and time.  Skin: Skin is warm and dry. No rash noted. No erythema.  Psychiatric: She has a normal mood and affect. Her behavior is normal.    ED Course  Procedures (including critical care time) Labs Review Labs Reviewed - No data to display  Imaging Review No results found.   MDM   1. URI (upper respiratory infection)    Resolving URI Tylenol, fluids, rest atrovent nasal spray for congestion Tessalon for cough Expect continued improvement over next 3-5 days   Ria ClockJennifer Lee H Keen Ewalt, PA 01/25/14 1418

## 2014-01-25 NOTE — ED Notes (Signed)
C/o URI type symptoms x 1 week. Cough, congestion, runny nose. NAD at present. States she has had minimal relief w Theraflu and alka seltzer

## 2014-01-27 NOTE — ED Provider Notes (Signed)
Medical screening examination/treatment/procedure(s) were performed by a resident physician or non-physician practitioner and as the supervising physician I was immediately available for consultation/collaboration.  Evan Corey, MD    Evan S Corey, MD 01/27/14 0757 

## 2014-03-06 ENCOUNTER — Emergency Department (INDEPENDENT_AMBULATORY_CARE_PROVIDER_SITE_OTHER)
Admission: EM | Admit: 2014-03-06 | Discharge: 2014-03-06 | Disposition: A | Discharge status: Home / Self Care | Payer: Self-pay | Source: Home / Self Care | Attending: Emergency Medicine | Admitting: Emergency Medicine

## 2014-03-06 ENCOUNTER — Encounter (HOSPITAL_COMMUNITY): Payer: Self-pay | Admitting: Emergency Medicine

## 2014-03-06 DIAGNOSIS — L0291 Cutaneous abscess, unspecified: Secondary | ICD-10-CM

## 2014-03-06 HISTORY — DX: Essential (primary) hypertension: I10

## 2014-03-06 MED ORDER — SULFAMETHOXAZOLE-TRIMETHOPRIM 800-160 MG PO TABS: 1.0000 | ORAL_TABLET | Freq: Two times a day (BID) | ORAL | Status: DC

## 2014-03-06 MED ORDER — LIDOCAINE-EPINEPHRINE (PF) 2 %-1:200000 IJ SOLN
INTRAMUSCULAR | Status: AC
  Filled 2014-03-06: qty 20

## 2014-03-06 MED ORDER — HYDROCODONE-ACETAMINOPHEN 5-325 MG PO TABS: ORAL_TABLET | ORAL | Status: DC

## 2014-03-06 MED ORDER — MUPIROCIN 2 % EX OINT: TOPICAL_OINTMENT | CUTANEOUS | Status: DC

## 2014-03-06 NOTE — ED Provider Notes (Signed)
  Chief Complaint   Abscess   History of Present Illness   Rebecca Stevenson is a 43 year old female who has a one-week history of an abscess on her left labia majora. It has not drained any pus. She denies any fever or chills. She's never had an abscess in this exact area before, but she has had multiple abscesses in various parts of her body, mostly on the inner thighs that have come and gone over the past several years. She has not had to have an incision and drainage for these. She has no history of MRSA or diabetes.  Review of Systems   Other than as noted above, the patient denies any of the following symptoms: Systemic:  No fever, chills or sweats. Skin:  No rash or itching.  PMFSH   Past medical history, family history, social history, meds, and allergies were reviewed. She has asthma and takes albuterol.  Physical Examination     Vital signs:  LMP 02/13/2014 Skin:  There is a large, raised, erythematous, tender abscess on the left labia majora, without any drainage.  There was no crepitus, necrosis, ecchymosis, or herrhagic bullae. Skin exam was otherwise normal.  No rash. Ext:  Distal pulses were full, patient has full ROM of all joints.  Procedure Note:  Verbal informed consent was obtained.  The patient was informed of the risks and benefits of the procedure and understands and accepts.  A time out was called and the identity of the patient and correct procedure was confirmed.   The abscess area described above was prepped with Betadine and alcohol and anesthetized with ethyl chloride spray and 3 mL of 2% Xylocaine with epinephrine.  Using a #11 scalpel blade, a singe straight incision was made into the area of fluctulence, yielding no prurulent drainage. There were some blood clots that could be expressed. Routine cultures were obtained.  Blunt dissection was used to break up loculations and the resulting wound cavity was packed with 1/4 inch Iodoform gauze.  A sterile  pressure dressing was applied.   Assessment   The encounter diagnosis was Abscess.  Since she has had recurring abscesses, a decontamination protocol is recommended.  Plan     1.  Meds:  The following meds were prescribed:   New Prescriptions   HYDROCODONE-ACETAMINOPHEN (NORCO/VICODIN) 5-325 MG PER TABLET    1 to 2 tabs every 4 to 6 hours as needed for pain.   MUPIROCIN OINTMENT (BACTROBAN) 2 %    Apply to both nostrils TID for 1 month.   SULFAMETHOXAZOLE-TRIMETHOPRIM (BACTRIM DS,SEPTRA DS) 800-160 MG PER TABLET    Take 1 tablet by mouth 2 (two) times daily.    2.  Patient Education/Counseling:  The patient was given appropriate handouts, wound care instructions, and instructed in pain control.  Suggest decontamination protocol with new Pearson ointment to the nostrils 3 times a day plus twice weekly Clorox baths for 3 months.  3.  Follow up:  The patient was instructed to leave the dressing in place and return here again in 48 hours for packing removal, or sooner if becoming worse in any way, and given some red flag symptoms such as fever which would prompt immediate return.       Reuben Likesavid C Keundra Petrucelli, MD 03/06/14 [phone]

## 2014-03-06 NOTE — ED Notes (Signed)
"  vaginal lip boil" , noticed one week ago.  History of the same.  Reports she wants antibiotics, has not had an abscess i/d

## 2014-03-06 NOTE — Discharge Instructions (Signed)
You have had an abscess drained.  An abscess is a collection of pus caused by infection with skin bacteria such as Streptococcus or Staphylococcus.  Since this is and infection, you may be contagious. ° °For the first 2 days, leave the dressing in place and keep it clean and dry. This means you should not get it wet.  You will have to take a sponge bath rather than a shower.  If the abscess was packed, we may instruct you to come back in 2 to 3 days to have the packing removed.  If the abscess was not packed, you may remove the dressing yourself in 2 days and take care of the wound yourself. ° °After the packing is out, change the dressing at least once a day.  You may bathe or shower once the packing has been removed.  Assemble all the dressing material before you change the dressing, wear gloves, dispose of the soiled dressing material well and wash your hands before and after changing the dressing.  Wash the area well with soap and water, taking care to remove all the dried blood and drainage.  Apply a thin layer of antibiotic ointment (Bacitracin or Polysporin) around the abscess cavity, then apply a gauze dressing.  You may want to use a non-adherent dressing like Telfa.  Fasten this in place well with tape.  Continue to change the dressing until there is no further drainage. ° °Finish up the entire prescription of any antibiotics that you have been given. ° °Take infectious precautions since the bacteria that cause these abscesses may be contagious.  Wash hands frequently or use hand sanitizer, especially after touching the abscess area or changing dressings.  Do not allow anyone else to use your towel or washcloth and wash these items after each use until the abscess has healed.  You may want to use an antibacterial soap such as Dial or Safeguard or a prescription body wash like Hibiclens.  You also may want to consider spraying the tub or shower with a disinfectant such a Lysol until the abscess has  healed. ° °Things that should prompt you to return to the office for a recheck include:  Fever over 100 degrees, increasing pain or drainage, failure of the abscess to heal after 10 days, or other skin lesions elsewhere.  ° °Bacterial infection is a leading cause of boils, abscesses and skin infections.  While this can be the cause of serious infections, it is most often easily treated, prevented, and cured. ° °This bacteria,like other bacterial infections, is something you catch from somebody or something.  It can be transmitted from family, friends, casual acquaintances or even pets by close person-to-person contact or contact with an infected object.  Bacteria live on the skin and in the nasal passages.  It can invade into the skin through a break in the skin or sometimes it just invades by itself into a skin pore causing an area of redness, swelling and pain.  When this happens, it can cause a sticking sensation, so many people mistake it for an insect bite.   ° °If it causes a collection of pus (a boil or abscess) it should be drained.  Often packing or a drain will be left in place to allow the pus to drain for a few days.  This packing will need to be removed after 2 to 3 days.  If the wound is deep, it may need to be repacked.  If there is no collection of   pus (cellulitis) incision and drainage is not immediately necessary, but antibiotics will be prescribed.  Sometimes cellulitis will turn into an abscess, so if symptoms persist, it's best to return for a recheck. ° °With bacterial infection, recurrence can be a problem.  This is because the bacteria can continue to live on the skin and in the nasal passages, presenting a risk for re-infection.  There are some steps that you can take to completely eradicate the infection and thus prevent future infections: ° °· Finish up your antibiotics completely.   °· Bacteria often take up residence in the nasal passages.  If they are not eliminated from this reservoir,  the infection will return again and again.  Apply an antibiotic ointment, mupirocin, to both nostrils 3 times daily for a month.  °· Decontamination of the skin is also necessary. The current recommendation is Clorox baths twice weekly for 3 months.  This can be done by diluting 4 oz of Clorox bleach in a tub of bathwater.  Then soak in this Clorox solution up to your neck for 15 minutes. When you get out of the tub, do not towel dry, allow the Clorox water to dry off your skin on its own.  °· Take infectious precautions:  Wash or sanitize hands frequently, spray tub or shower with Lysol after use, us towels or wash cloths only once, then launder, do not share towels or wash cloths with anyone else, do not share clothing with anyone else, launder clothing and bed clothes frequently. ° ° ° ° °

## 2014-03-08 ENCOUNTER — Emergency Department (INDEPENDENT_AMBULATORY_CARE_PROVIDER_SITE_OTHER)
Admission: EM | Admit: 2014-03-08 | Discharge: 2014-03-08 | Disposition: A | Discharge status: Home / Self Care | Payer: Self-pay | Source: Home / Self Care | Attending: Emergency Medicine | Admitting: Emergency Medicine

## 2014-03-08 DIAGNOSIS — L0291 Cutaneous abscess, unspecified: Secondary | ICD-10-CM

## 2014-03-08 MED ORDER — MUPIROCIN 2 % EX OINT
TOPICAL_OINTMENT | CUTANEOUS | Status: DC
Start: 2014-03-08 — End: 2014-05-07

## 2014-03-08 NOTE — Discharge Instructions (Signed)
Now that the packing has been removed from your abscess, you may bathe and shower as normal.  You should change the dressing at least once a day.  You may change it more often if it becomes soiled with drainage.  Wash the wound well with soap and water, pat dry, apply an antibiotic ointment (Neosporin, Polysporin, or Bacitracin are all OK), then cover with a non-stick dressing such as Telfa with several layers of plain gause over that to absorb drainage.  You may secure this in place with tape or with roll gauze and tape.  Keep the wound covered for until it stops draining.  This usually takes 7 days, but may be longer.  Return for a recheck if you have heavy bleeding, increasing pain, fever, or the wound looks worse.  ° °

## 2014-03-08 NOTE — ED Provider Notes (Signed)
  Chief Complaint   No chief complaint on file.   History of Present Illness   Rebecca SchneidersDanielle S Zogg is a 43 year old female who returns for follow-up on abscess on her left labia majora that was incised and drained 2 days ago. She is here for packing removal. The pain is a little bit better. She denies any fever or chills. Her culture is growing out Staphylococcus aureus, sensitivities not known yet.  Review of Systems   Other than as noted above, the patient denies any of the following symptoms: Systemic:  No fevers, chills, sweats, weight loss or gain, fatigue, or tiredness.  PMFSH   Past medical history, family history, social history, meds, and allergies were reviewed.    Physical Examination    Vital signs:  BP 108/53 mmHg  Pulse 86  Temp(Src) 98.9 F (37.2 C) (Oral)  Resp 18  SpO2 100%  LMP 02/13/2014 General:  Alert and oriented.  In no distress.  Skin warm and dry. Genital exam: She has a dressing with packing in place with a left labia majora abscess. This is still somewhat swollen and tender. There is no fluctuance or purulent drainage.  Labs   Results for orders placed or performed during the hospital encounter of 03/06/14  Culture, routine-abscess  Result Value Ref Range   Specimen Description ABSCESS    Special Requests LEFT LABIA MAJORA    Gram Stain PENDING    Culture      MODERATE STAPHYLOCOCCUS AUREUS Note: RIFAMPIN AND GENTAMICIN SHOULD NOT BE USED AS SINGLE DRUGS FOR TREATMENT OF STAPH INFECTIONS. Performed at [facility]    Report Status PENDING     Course in Urgent Care Center   The packing was removed, the wound was cleansed with saline, antibiotic ointment was applied and a clean, sterile dressing.  Assessment   The encounter diagnosis was Abscess.  Plan   1.  Meds:  The following meds were prescribed:   Discharge Medication List as of 03/08/2014  4:59 PM      2.  Patient Education/Counseling:  The patient was given  appropriate handouts, self care instructions, and instructed in symptomatic relief.  Instructed in a MRSA decontamination protocol.  3.  Follow up:  The patient was told to follow up here if no better in 3 to 4 days, or sooner if becoming worse in any way, and given some red flag symptoms such as fever or worsening pain or swelling which would prompt immediate return.        Reuben Likesavid C [place], MD 03/08/14 [phone]

## 2014-03-09 ENCOUNTER — Telehealth (HOSPITAL_COMMUNITY): Payer: Self-pay | Admitting: *Deleted

## 2014-03-09 LAB — CULTURE, ROUTINE-ABSCESS

## 2014-03-09 NOTE — ED Notes (Signed)
Abscess culture L labia: Mod. MRSA.  Pt.adequately treated with Bactrim DS and Bactroban. Dr. Lorenz CoasterKeller wants pt. to do clorox baths 2x/week for 3 mos. and Bactroban to nostrils for 3 mos.  I called pt. But cell number disconnected and home number message no available at this time.  Call 1. Rebecca Stevenson, Rebecca Stevenson 03/09/2014

## 2014-03-10 NOTE — ED Notes (Signed)
Both numbers are not in service.  I called contact-pt's. Mother and she said she will call her grand-daughter to contact her. She will have her call back.  Call 2.

## 2014-03-11 MED ORDER — DOXYCYCLINE HYCLATE 100 MG PO TABS: 100.0000 mg | ORAL_TABLET | Freq: Two times a day (BID) | ORAL | Status: DC

## 2014-03-11 NOTE — ED Notes (Addendum)
Pt. called back and said she is having a reaction to the Septra. I told her I would let Dr. Lorenz CoasterKeller.  I reviewed the [facility] MRSA instructions with her and decontamination protocol with her. 03/11/2014

## 2014-03-11 NOTE — ED Notes (Signed)
The patient calls back today stating that she had severe intolerance to the Septra with an asthma attack, chills, headache, blood in urine, and fatigue. She stopped it 24 hours ago and now feels perfectly fine. She had some itching but no rash. No difficulty breathing. I told her to stop the Septra and will call in a prescription for doxycycline instead. I reinforced the instructions about her decontamination protocol.  Reuben Likesavid C Kailynn Satterly, MD 03/11/14 [phone]

## 2014-03-17 ENCOUNTER — Emergency Department (INDEPENDENT_AMBULATORY_CARE_PROVIDER_SITE_OTHER)
Admission: EM | Admit: 2014-03-17 | Discharge: 2014-03-17 | Disposition: A | Discharge status: Home / Self Care | Payer: Self-pay | Source: Home / Self Care | Attending: Emergency Medicine | Admitting: Emergency Medicine

## 2014-03-17 ENCOUNTER — Encounter (HOSPITAL_COMMUNITY): Payer: Self-pay | Admitting: Emergency Medicine

## 2014-03-17 DIAGNOSIS — R55 Syncope and collapse: Secondary | ICD-10-CM

## 2014-03-17 NOTE — ED Provider Notes (Signed)
Chief Complaint   Follow-up   History of Present Illness   Rebecca SchneidersDanielle S Stamour is a 43 year old female who was seen here on November 14 and the 16th. She presented with an abscess to her left labia majora. This was incised and drained. She returned 2 days later for packing removal. She was initially placed on Septra. This resulted in several side effects including fatigue, tiredness, generalized itching, and asthma attack, and blood in her urine. She called us and was told to stop the Septra. She was placed on doxycycline instead which had no side effects. She is finishing that up right now. She felt tired and rundown after the incision and drainage and while taking the sulfa. The patient states she was not eating and drinking much. This past Saturday, November 21 she was at work and she suddenly felt dizzy and lightheaded. She did not have any chest pain, shortness of breath, palpitations, or and did not lose consciousness. EMS was called. They evaluated her there and her blood pressure was 136/98 lying and 140/80 sitting. Her CBG was 129. They did an EKG which is pictured below and was read as normal. She was told to follow-up here. She's not been back to work since then needs a note to get back into work. She is feeling a lot better now, and states she feels completely fine. She denies any further episodes of syncope or presyncope. She's had no chest pain, shortness of breath, palpitations, injury, drug or alcohol use, prior syncope, family history of syncope, headache, back pain, or rectal or vaginal bleeding. She denies any vomiting or diarrhea. The patient has had a tubal ligation, and her menses have been regular with her last menstrual period being within the past month.      Review of Systems   Other than noted above, the patient denies any of the following symptoms. Systemic:  No fever, chills, or fatigue. Pulmonary:  No cough, wheezing, shortness of breath. Cardiac:  No chest pain,  tightness, pressure, palpitations, PND, orthopnea, or edema. Ext:  No leg pain or swelling. Neuro:  No weakness, paresthesias, or difficulty with speech or gait. No vertigo or difficulty with coordination or balance. No seizure activity, tongue biting, or incontinence.  Psych:  No anxiety or depression.  PMFSH   Past medical history, family history, social history, meds, and allergies were reviewed.    Physical Examination    Vital signs:  LMP 03/12/2014  Orthostatic VS for the past 24 hrs:  BP- Lying Pulse- Lying BP- Sitting Pulse- Sitting BP- Standing at 0 minutes Pulse- Standing at 0 minutes  03/17/14 2018 (!) 129/91 mmHg 62 117/83 mmHg 62 122/82 mmHg 62   Gen:  Alert, oriented, in no distress, skin warm and dry. Eye:  PERRL, lids and conjunctivas normal.  No stare or lid lag. ENT:  Mucous membranes moist, pharynx clear. Neck:  Supple, no adenopathy or tenderness.  No JVD.  Thyroid not enlarged. Lungs:  Clear to auscultation, no wheezes, rales or rhonchi.  No respiratory distress. Heart:  Regular rhythm, no extrasystoles.  No gallops, murmers, clicks or rubs. Abdomen:  Soft, nontender, no organomegaly or mass.  Bowel sounds normal.  No pulsatile abdominal mass or bruit. Ext:  No edema. Pulses full and equal. Neuro:  Neurological examination: The patient is alert and oriented x3. Speech is clear, fluent, and appropriate. Cranial nerves are intact. There is no pronator drift and finger to nose was normal. Muscle strength, sensation, and DTRs are normal. Babinskis are  downgoing. Station and gait were normal. Romberg sign is negative, patient is able to perform tandem gait well. Skin:  Warm and dry.  No rash.  Assessment   The encounter diagnosis was Pre-syncope.  There is no evidence of subarachnoid hemorrhage, ruptured aortic aneurism, pulmonary embolism, acute coronary syndrome, aortic dissection, anemia, blood loss, dehydration, electrolyte imbalance, or ectopic pregnancy.  Presyncope may have been due to dehydration, or just being sick, and not feeling well. Right now she seems to be back to normal. There is no evidence of orthostasis, her EKG was completely normal, vital signs look good right now, and I think she can return to work.  Plan     1.  Meds:  The following meds were prescribed:   New Prescriptions   No medications on file    2.  Patient Education/Counseling:  The patient was given appropriate handouts, self care instructions, and instructed in symptomatic relief.  Instructed to get extra fluids.  3.  Follow up:  The patient was told to follow up here if no better in 3 to 4 days, or sooner if becoming worse in any way, and given some red flag symptoms such as syncope, presyncope, dyspnea, or chest pain which would prompt immediate return.      Reuben Likesavid C Lusine Corlett, MD 03/17/14 [id number]

## 2014-03-17 NOTE — Discharge Instructions (Signed)
Get extra fluids.  If dizzy spells persist, return for a recheck.

## 2014-03-17 NOTE — ED Notes (Signed)
Patient reports on Saturday ( 11/21) she had a near syncopal episode at work.  Ems called.  Patient concerned about vital signs from then.  Patient also needs a return to work note.  Patient reports she feels great, no pain, eating and drinking without difficulty, no diarrhea

## 2014-04-23 DIAGNOSIS — R7303 Prediabetes: Secondary | ICD-10-CM

## 2014-04-23 HISTORY — DX: Prediabetes: R73.03

## 2014-04-30 ENCOUNTER — Emergency Department (HOSPITAL_COMMUNITY)
Admission: EM | Admit: 2014-04-30 | Discharge: 2014-04-30 | Disposition: A | Discharge status: Home / Self Care | Payer: Self-pay | Attending: Emergency Medicine | Admitting: Emergency Medicine

## 2014-04-30 ENCOUNTER — Encounter (HOSPITAL_COMMUNITY): Payer: Self-pay | Admitting: *Deleted

## 2014-04-30 DIAGNOSIS — G4700 Insomnia, unspecified: Secondary | ICD-10-CM | POA: Insufficient documentation

## 2014-04-30 DIAGNOSIS — F419 Anxiety disorder, unspecified: Secondary | ICD-10-CM | POA: Insufficient documentation

## 2014-04-30 DIAGNOSIS — R251 Tremor, unspecified: Secondary | ICD-10-CM | POA: Insufficient documentation

## 2014-04-30 DIAGNOSIS — Z87891 Personal history of nicotine dependence: Secondary | ICD-10-CM | POA: Insufficient documentation

## 2014-04-30 DIAGNOSIS — Y9289 Other specified places as the place of occurrence of the external cause: Secondary | ICD-10-CM | POA: Insufficient documentation

## 2014-04-30 DIAGNOSIS — J45909 Unspecified asthma, uncomplicated: Secondary | ICD-10-CM | POA: Insufficient documentation

## 2014-04-30 DIAGNOSIS — Y9389 Activity, other specified: Secondary | ICD-10-CM | POA: Insufficient documentation

## 2014-04-30 DIAGNOSIS — Z3202 Encounter for pregnancy test, result negative: Secondary | ICD-10-CM | POA: Insufficient documentation

## 2014-04-30 DIAGNOSIS — W1839XA Other fall on same level, initial encounter: Secondary | ICD-10-CM | POA: Insufficient documentation

## 2014-04-30 DIAGNOSIS — Z79899 Other long term (current) drug therapy: Secondary | ICD-10-CM | POA: Insufficient documentation

## 2014-04-30 DIAGNOSIS — R55 Syncope and collapse: Secondary | ICD-10-CM | POA: Insufficient documentation

## 2014-04-30 DIAGNOSIS — I10 Essential (primary) hypertension: Secondary | ICD-10-CM | POA: Insufficient documentation

## 2014-04-30 DIAGNOSIS — Y998 Other external cause status: Secondary | ICD-10-CM | POA: Insufficient documentation

## 2014-04-30 LAB — COMPREHENSIVE METABOLIC PANEL
ALT: 18 U/L (ref 0–35)
ANION GAP: 7 (ref 5–15)
AST: 22 U/L (ref 0–37)
Albumin: 3.2 g/dL — ABNORMAL LOW (ref 3.5–5.2)
Alkaline Phosphatase: 58 U/L (ref 39–117)
BILIRUBIN TOTAL: 0.2 mg/dL — AB (ref 0.3–1.2)
BUN: 8 mg/dL (ref 6–23)
CO2: 27 mmol/L (ref 19–32)
Calcium: 8.3 mg/dL — ABNORMAL LOW (ref 8.4–10.5)
Chloride: 104 mEq/L (ref 96–112)
Creatinine, Ser: 0.68 mg/dL (ref 0.50–1.10)
GLUCOSE: 111 mg/dL — AB (ref 70–99)
Potassium: 3.7 mmol/L (ref 3.5–5.1)
Sodium: 138 mmol/L (ref 135–145)
Total Protein: 6.5 g/dL (ref 6.0–8.3)

## 2014-04-30 LAB — URINALYSIS, ROUTINE W REFLEX MICROSCOPIC
Bilirubin Urine: NEGATIVE
GLUCOSE, UA: NEGATIVE mg/dL
Hgb urine dipstick: NEGATIVE
Ketones, ur: NEGATIVE mg/dL
Leukocytes, UA: NEGATIVE
NITRITE: NEGATIVE
PROTEIN: NEGATIVE mg/dL
Specific Gravity, Urine: 1.028 (ref 1.005–1.030)
Urobilinogen, UA: 0.2 mg/dL (ref 0.0–1.0)
pH: 6.5 (ref 5.0–8.0)

## 2014-04-30 LAB — CBC WITH DIFFERENTIAL/PLATELET
Basophils Absolute: 0 10*3/uL (ref 0.0–0.1)
Basophils Relative: 0 % (ref 0–1)
Eosinophils Absolute: 0 10*3/uL (ref 0.0–0.7)
Eosinophils Relative: 1 % (ref 0–5)
HCT: 33.9 % — ABNORMAL LOW (ref 36.0–46.0)
Hemoglobin: 10.8 g/dL — ABNORMAL LOW (ref 12.0–15.0)
Lymphocytes Relative: 28 % (ref 12–46)
Lymphs Abs: 1.6 10*3/uL (ref 0.7–4.0)
MCH: 27.6 pg (ref 26.0–34.0)
MCHC: 31.9 g/dL (ref 30.0–36.0)
MCV: 86.5 fL (ref 78.0–100.0)
Monocytes Absolute: 0.5 10*3/uL (ref 0.1–1.0)
Monocytes Relative: 9 % (ref 3–12)
NEUTROS ABS: 3.6 10*3/uL (ref 1.7–7.7)
Neutrophils Relative %: 62 % (ref 43–77)
PLATELETS: 210 10*3/uL (ref 150–400)
RBC: 3.92 MIL/uL (ref 3.87–5.11)
RDW: 12.9 % (ref 11.5–15.5)
WBC: 5.7 10*3/uL (ref 4.0–10.5)

## 2014-04-30 LAB — T4, FREE: FREE T4: 0.95 ng/dL (ref 0.80–1.80)

## 2014-04-30 LAB — POC URINE PREG, ED: Preg Test, Ur: NEGATIVE

## 2014-04-30 LAB — TSH: TSH: 0.422 u[IU]/mL (ref 0.350–4.500)

## 2014-04-30 MED ORDER — ZOLPIDEM TARTRATE 5 MG PO TABS: 5.0000 mg | ORAL_TABLET | Freq: Every evening | ORAL | Status: DC | PRN

## 2014-04-30 NOTE — ED Notes (Signed)
Patient states she has insomnia.  She has been up for several days.  She states 2 nights ago she states she got out of bed to get a drink.  She states she felt hot inside and she collapsed.  Patient states she fell to the ground.  She states she does not know how long she was down there.  Patient states on Tuesday or Wed prior to, she states she was having visual hallucinations.  Patient states this subsided and she is now having a twitch.  Patient is not currently on any meds.  Patient does not have a regular doctor.  Patient is alert and oriented at time.

## 2014-04-30 NOTE — Discharge Instructions (Signed)
Generalized Anxiety Disorder Generalized anxiety disorder (GAD) is a mental disorder. It interferes with life functions, including relationships, work, and school. GAD is different from normal anxiety, which everyone experiences at some point in their lives in response to specific life events and activities. Normal anxiety actually helps [place] prepare for and get through these life events and activities. Normal anxiety goes away after the event or activity is over.  GAD causes anxiety that is not necessarily related to specific events or activities. It also causes excess anxiety in proportion to specific events or activities. The anxiety associated with GAD is also difficult to control. GAD can vary from mild to severe. People with severe GAD can have intense waves of anxiety with physical symptoms (panic attacks).  SYMPTOMS The anxiety and worry associated with GAD are difficult to control. This anxiety and worry are related to many life events and activities and also occur more days than not for 6 months or longer. People with GAD also have three or more of the following symptoms (one or more in children):  Restlessness.   Fatigue.  Difficulty concentrating.   Irritability.  Muscle tension.  Difficulty sleeping or unsatisfying sleep. DIAGNOSIS GAD is diagnosed through an assessment by your health care provider. Your health care provider will ask you questions aboutyour mood,physical symptoms, and events in your life. Your health care provider may ask you about your medical history and use of alcohol or drugs, including prescription medicines. Your health care provider may also do a physical exam and blood tests. Certain medical conditions and the use of certain substances can cause symptoms similar to those associated with GAD. Your health care provider may refer you to a mental health specialist for further evaluation. TREATMENT The following therapies are usually used to treat GAD:    Medication. Antidepressant medication usually is prescribed for long-term daily control. Antianxiety medicines may be added in severe cases, especially when panic attacks occur.   Talk therapy (psychotherapy). Certain types of talk therapy can be helpful in treating GAD by providing support, education, and guidance. A form of talk therapy called cognitive behavioral therapy can teach you healthy ways to think about and react to daily life events and activities.  Stress managementtechniques. These include yoga, meditation, and exercise and can be very helpful when they are practiced regularly. A mental health specialist can help determine which treatment is best for you. Some people see improvement with one therapy. However, other people require a combination of therapies. Document Released: 08/04/2012 Document Revised: 08/24/2013 Document Reviewed: 08/04/2012 [facility] Patient Information 2015 [place], [place]. This information is not intended to replace advice given to you by your health care provider. Make sure you discuss any questions you have with your health care provider. Insomnia Insomnia is frequent trouble falling and/or staying asleep. Insomnia can be a long term problem or a short term problem. Both are common. Insomnia can be a short term problem when the wakefulness is related to a certain stress or worry. Long term insomnia is often related to ongoing stress during waking hours and/or poor sleeping habits. Overtime, sleep deprivation itself can make the problem worse. Every little thing feels more severe because you are overtired and your ability to cope is decreased. CAUSES   Stress, anxiety, and depression.  Poor sleeping habits.  Distractions such as TV in the bedroom.  Naps close to bedtime.  Engaging in emotionally charged conversations before bed.  Technical reading before sleep.  Alcohol and other sedatives. They may make the  problem worse. They can hurt normal sleep  patterns and normal dream activity.  Stimulants such as caffeine for several hours prior to bedtime.  Pain syndromes and shortness of breath can cause insomnia.  Exercise late at night.  Changing time zones may cause sleeping problems (jet lag). It is sometimes helpful to have someone observe your sleeping patterns. They should look for periods of not breathing during the night (sleep apnea). They should also look to see how long those periods last. If you live alone or observers are uncertain, you can also be observed at a sleep clinic where your sleep patterns will be professionally monitored. Sleep apnea requires a checkup and treatment. Give your caregivers your medical history. Give your caregivers observations your family has made about your sleep.  SYMPTOMS   Not feeling rested in the morning.  Anxiety and restlessness at bedtime.  Difficulty falling and staying asleep. TREATMENT   Your caregiver may prescribe treatment for an underlying medical disorders. Your caregiver can give advice or help if you are using alcohol or other drugs for self-medication. Treatment of underlying problems will usually eliminate insomnia problems.  Medications can be prescribed for short time use. They are generally not recommended for lengthy use.  Over-the-counter sleep medicines are not recommended for lengthy use. They can be habit forming.  You can promote easier sleeping by making lifestyle changes such as:  Using relaxation techniques that help with breathing and reduce muscle tension.  Exercising earlier in the day.  Changing your diet and the time of your last meal. No night time snacks.  Establish a regular time to go to bed.  Counseling can help with stressful problems and worry.  Soothing music and white noise may be helpful if there are background noises you cannot remove.  Stop tedious detailed work at least one hour before bedtime. HOME CARE INSTRUCTIONS   Keep a diary.  Inform your caregiver about your progress. This includes any medication side effects. See your caregiver regularly. Take note of:  Times when you are asleep.  Times when you are awake during the night.  The quality of your sleep.  How you feel the next day. This information will help your caregiver care for you.  Get out of bed if you are still awake after 15 minutes. Read or do some quiet activity. Keep the lights down. Wait until you feel sleepy and go back to bed.  Keep regular sleeping and waking hours. Avoid naps.  Exercise regularly.  Avoid distractions at bedtime. Distractions include watching television or engaging in any intense or detailed activity like attempting to balance the household checkbook.  Develop a bedtime ritual. Keep a familiar routine of bathing, brushing your teeth, climbing into bed at the same time each night, listening to soothing music. Routines increase the success of falling to sleep faster.  Use relaxation techniques. This can be using breathing and muscle tension release routines. It can also include visualizing peaceful scenes. You can also help control troubling or intruding thoughts by keeping your mind occupied with boring or repetitive thoughts like the old concept of counting sheep. You can make it more creative like imagining planting one beautiful flower after another in your backyard garden.  During your day, work to eliminate stress. When this is not possible use some of the previous suggestions to help reduce the anxiety that accompanies stressful situations. MAKE SURE YOU:   Understand these instructions.  Will watch your condition.  Will get help right away if  you are not doing well or get worse. Document Released: 04/06/2000 Document Revised: 07/02/2011 Document Reviewed: 05/07/2007 [facility] Patient Information 2015 [place], [place]. This information is not intended to replace advice given to you by your health care provider. Make sure  you discuss any questions you have with your health care provider.  Emergency Department Resource Guide 1) Find a Doctor and Pay Out of Pocket Although you won't have to find out who is covered by your insurance plan, it is a good idea to ask around and get recommendations. You will then need to call the office and see if the doctor you have chosen will accept you as a new patient and what types of options they offer for patients who are self-pay. Some doctors offer discounts or will set up payment plans for their patients who do not have insurance, but you will need to ask so you aren't surprised when you get to your appointment.  2) Contact Your Local Health Department Not all health departments have doctors that can see patients for sick visits, but many do, so it is worth a call to see if yours does. If you don't know where your local health department is, you can check in your phone book. The CDC also has a tool to help you locate your state's health department, and many state websites also have listings of all of their local health departments.  3) Find a Walk-in Clinic If your illness is not likely to be very severe or complicated, you may want to try a walk in clinic. These are popping up all over the country in pharmacies, drugstores, and shopping centers. They're usually staffed by nurse practitioners or physician assistants that have been trained to treat common illnesses and complaints. They're usually fairly quick and inexpensive. However, if you have serious medical issues or chronic medical problems, these are probably not your best option.  No Primary Care Doctor: - Call Health Connect at  [phone] - they can help you locate a primary care doctor that  accepts your insurance, provides certain services, etc. - Physician Referral Service- [phone]  Chronic Pain Problems: Organization         Address  Phone   Notes  [facility]  [phone] Patients need to  be referred by their primary care doctor.   Medication Assistance: Organization         Address  Phone   Notes  [facility] Medication [facility] [address] [place], [place] [address] [phone] --Must be a resident of [facility] -- Must have NO insurance coverage whatsoever (no Medicaid/ Medicare, etc.) -- The pt. MUST have a primary care doctor that directs their care regularly and follows them in the community   [facility]ist  [phone]   [employer]  [phone]    Agencies that provide inexpensive medical care: Organization         Address  Phone   Notes  [name]  [phone]   [name]    [phone]   [facility] [address] [place], [place] [address] [phone]   [facility] [address] [place] [place] [phone]   Planned Parenthood    [phone]   [facility]    [phone]   [facility]  [address], [place] Phone:  [phone], Fax:  [phone] Hours of Operation:  9 am - 6 pm, M-F.  Also accepts Medicaid/Medicare and self-pay.  [facility]  [address], [place] Phone: [phone], Fax: [phone]. Hours of Operation:  8:30 am - 5:30 pm, M-F.  Also accepts Medicaid and self-pay.  [facility] [place] [address], [place] Phone: [phone]) [phone]   [facility] [address], [place], [place] [phone], Ext. [phone] Mondays & Thursdays: 7-9 AM.  First 15 patients are seen on a first come, first serve basis.    Medicaid-accepting [facility] Providers:  Organization         Address  Phone   Notes  [facility] [address], [place] [phone] Also accepts self-pay patients.  [facility] [address], [place]  [phone]   [facility] [address], [place] [phone]   [facility] [address] [place] [address], [place] [phone]   Renaye RakersVeita Bland [address], [place]   [phone] Only accepts [place] Access [place] patients after they have their name applied to their card.   Self-Pay (no insurance) in [facility]:  Organization         Address  Phone   Notes  Sickle Cell Patients, [facility] [address], [place] [phone]   [facility] [address], [place] [phone]) [phone]   [facility]  [address], [place] [phone]   [facility]/Dr. Osei-Bonsu  [address] [place] [address], [place] or [phone], [place] [phone] Phone number for both [place] and [place] locations is the same.  [facility] [address], [place] [phone]   [facility] [address] [place] [address], [place] or [address] [phone]   [facility] [address], [place] [phone], phone; [phone], fax Sees patients 1st and 3rd Saturday of every month.  Must not qualify for public or private insurance (i.e. Medicaid, Medicare, [place] Health Choice, Veterans' Benefits)  Household income should be no more than 200% of the poverty level The clinic cannot treat you if you are pregnant or think you are pregnant  Sexually transmitted diseases are not treated at the clinic.    Dental Care: Organization         Address  Phone  Notes  [facility] of [facility] [address], [place] [phone] Accepts children up to age 43 who are enrolled in [place] or [place] Health Choice; pregnant women with a Medicaid card; and children who have applied for Medicaid or [place] Health Choice, but were declined, whose parents can  pay a reduced fee at time of service.  [facility] of [facility] [place]  [address], [place] [phone] Accepts children up to age 421 who are enrolled in [place] or [place] Health Choice; pregnant women with a Medicaid card; and children who have applied for Medicaid or [place] Health Choice, but were declined, whose parents can pay a reduced fee at time of service.  [facility]  [address], [place] [phone] Patients are seen by appointment only. Walk-ins are not accepted. [facility] will see patients 18 years  of age and older. Monday - Tuesday (8am-5pm) Most Wednesdays (8:30-5pm) $30 per visit, cash only  [facility] Adult Dental Access PROGRAM  [address], [facility] [phone] Patients are seen by appointment only. Walk-ins are not accepted. [facility] will see patients 29 years of age and older. One Wednesday Evening (Monthly: Volunteer Based).  $30 per visit, cash only  [employer]  [phone] for adults; Children under age 45, call Graduate Pediatric Dentistry at [phone]. Children aged 33-14, please call [phone] to request a pediatric application.  Dental services are provided in all areas of dental care including fillings, crowns and bridges, complete and partial dentures, implants, gum treatment, root canals, and extractions. Preventive care is also provided. Treatment is provided to both adults and children. Patients are selected via a lottery and there is often a waiting list.   [facility] [address] [place]  [phone] [email or web address]   [facility] [address] [place], [place] [phone], Ext. [phone] Second and Fourth Thursday of each month, opens at 6:30 AM; Clinic ends at 9 AM.  Patients are seen on a first-come first-served basis, and a limited number are seen during each clinic.   [facility]  [address] [place], [place] [phone]   Eligibility Requirements You must have lived in [place], [place], or [place] counties for at least the last three months.   You cannot be eligible for state or federal sponsored [place], including [employer], [place], or [employer].   You generally cannot be eligible for healthcare insurance through your employer.    How to apply: Eligibility screenings are held every Tuesday and Wednesday afternoon from 1:00 pm until 4:00 pm. You do not need an appointment for the interview!  [facility] [address] [place], [place] [phone]   [facility] Health Department  [phone]   [facility] Department  [phone]   [facility] Health Department  [phone]    Behavioral Health Resources in the Community: Intensive Outpatient Programs Organization         Address  Phone  Notes  [facility] Services [address], [place], [place] [phone]   [facility] Outpatient [address], [place], [place] [phone]   [facility] [address] [place], [place]  [phone]   [facility] [address]  [place], [place] [phone] or [phone]   Substance Abuse Resources Organization         Address  Phone  Notes  [facility]  [phone]   [facility]  [phone]   [facility] [place]  3307252920   Floydene Flock  [phone]   Residential & Outpatient Substance Abuse Program  [phone]   Psychological Services Organization         Address  Phone  Notes  [facility] Behavior[facility] Health  [phone]   [facility] Services  [phone]   [facility] Mental Health [address] [place] [phone] or [phone]    Mobile Crisis Teams Organization         Address  Phone  Notes  [facility], Mobile Crisis Care Unit  [phone]    [facility]  [address] [place], [place] [phone]   [facility] [address] [place] [phone]    Self-Help/Support Groups Organization  Address  Phone             Notes  [facility] [place] - variety of support groups  [phone]- I7437963 Call for more information  Narcotics Anonymous (NA), [facility] [address], [employer] [place]  2 meetings at this location   Statistician         Address  Phone  Notes  [facility] [address],    [place]  [phone]   [facility]  [address] [place] [address], [place], [place] [phone]   [facility] [address] [place], [place] [phone] Admissions: 8am-3pm M-F  [facility] [address]    [place], [place] [phone]   [facility] [address] [place], [place], [place] [phone]   [facility] [address],  [place], [place] [phone]   [facility] - Intensive Outpatient [address], [place], [place] [phone]   [facility] ([facility] [address]  [place], [place] [phone] or [phone]   [facility] ([facility]) [address] [place], [place] [phone] Accepts Medicaid  [facility] [place] [address]  [place] [phone] Substance Abuse/Addiction Treatment   [facility] Organization         Address  Phone  Notes  [facility]  [phone]   Angie Fava, PhD [address] [employer] [employer], [place]   [phone] or [phone]   [facility]   [address] [place], [place] [phone]   [facility] [address] [place], [place] [phone] Insurance/Medicaid/sponsorship through [facility] and Families [address]                                     [place], [place] [phone] Therapy/tele-psych/case  [facility] [address], [place] [phone]    Dr. Lolly Mustache  [phone]   [facility]  [employer] [facility] Dept. 1) [address] [place] 2) [address] [place] 3)  [address], [place] [phone]  [phone]   [facility] Child Abuse Hotline [phone] or [phone] (After Hours)

## 2014-04-30 NOTE — ED Provider Notes (Signed)
CSN: [id number]     Arrival date & time 04/30/14  4098 History   First MD Initiated Contact with Patient 04/30/14 [phone]     Chief Complaint  Patient presents with  . Fall     (Consider location/radiation/quality/duration/timing/severity/associated sxs/prior Treatment) HPI The patient has multiple complaints. Chiefly it appears that she is having problems with insomnia. She reports that this has been a problem for years. She points however for the past 3 or more nights she has not been able to sleep at all. She was that is not typical. The patient reports that she does work second shift and usually gets off work around 11 or midnight. She reports that after that she just stays up staring at the television blankly and can't seem to sleep. She reports 2 nights ago she had fallen asleep and then woke up and wanted to see what time it was. She reports that she stood up and reached for her phone and a drink at the same time and then she thinks that she collapsed to the floor. She was she subsequently got up but doesn't know how long that was. She reports she thinks she got up because she heard her daughter's phone in the next room. She has been going about her usual activities she does not have headache or confusion. She reports she just feels like she is always anxious and moving her fingers around a lot and just feels "twitchy". She has not had fevers or chills. She reports sometimes she notes that her stomach was hurting but doesn't anymore and she also felt that her hands were swollen but they again are not at this time. Past Medical History  Diagnosis Date  . Asthma   . Hypertension    History reviewed. No pertinent past surgical history. No family history on file. History  Substance Use Topics  . Smoking status: Former Games developer  . Smokeless tobacco: Not on file  . Alcohol Use: No   OB History    No data available     Review of Systems  10 Systems reviewed and are negative for acute change  except as noted in the HPI.   Allergies  Septra  Home Medications   Prior to Admission medications   Medication Sig Start Date End Date Taking? Authorizing Provider  ibuprofen (ADVIL,MOTRIN) 200 MG tablet Take 600 mg by mouth every 6 (six) hours as needed for mild pain.   Yes Historical Provider, MD  Multiple Vitamins-Minerals (WOMENS MULTIVITAMIN PLUS) TABS Take 1 tablet by mouth daily.   Yes Historical Provider, MD  albuterol (PROVENTIL HFA;VENTOLIN HFA) 108 (90 BASE) MCG/ACT inhaler Inhale 2 puffs into the lungs every 6 (six) hours as needed. For asthma symptoms    Historical Provider, MD  albuterol (PROVENTIL HFA;VENTOLIN HFA) 108 (90 BASE) MCG/ACT inhaler Inhale 1-2 puffs into the lungs every 6 (six) hours as needed for wheezing. Patient not taking: Reported on 04/30/2014 08/25/12   Reuben Likes, MD  albuterol (PROVENTIL HFA;VENTOLIN HFA) 108 (90 BASE) MCG/ACT inhaler Inhale 1-2 puffs into the lungs every 6 (six) hours as needed for wheezing or shortness of breath. Patient not taking: Reported on 04/30/2014 01/25/14   Mathis Fare Presson, PA  benzonatate (TESSALON) 100 MG capsule Take 1 capsule (100 mg total) by mouth 3 (three) times daily as needed for cough. Patient not taking: Reported on 04/30/2014 01/25/14   Mathis Fare Presson, PA  cyclobenzaprine (FLEXERIL) 10 MG tablet Take 1 tablet (10 mg total) by mouth  2 (two) times daily as needed for muscle spasms. Patient not taking: Reported on 04/30/2014 09/26/13   Roma KayserKatherine P Schorr, NP  diphenhydrAMINE (BENADRYL) 25 mg capsule Take 25 mg by mouth daily as needed. For allergies    Historical Provider, MD  diphenoxylate-atropine (LOMOTIL) 2.5-0.025 MG per tablet Take 1 tablet by mouth 4 (four) times daily as needed for diarrhea or loose stools. Patient not taking: Reported on 04/30/2014 08/25/12   Reuben Likesavid C Keller, MD  doxycycline (VIBRA-TABS) 100 MG tablet Take 1 tablet (100 mg total) by mouth 2 (two) times daily. Patient not taking: Reported on  04/30/2014 03/11/14   Reuben Likesavid C Keller, MD  HYDROcodone-acetaminophen (NORCO/VICODIN) 5-325 MG per tablet 1 to 2 tabs every 4 to 6 hours as needed for pain. Patient not taking: Reported on 04/30/2014 03/06/14   Reuben Likesavid C Keller, MD  ipratropium (ATROVENT) 0.06 % nasal spray Place 2 sprays into both nostrils 4 (four) times daily. As needed for nasal congestion Patient not taking: Reported on 04/30/2014 01/25/14   Ria ClockJennifer Lee H Presson, PA  ivermectin (STROMECTOL) 3 MG TABS Take 7 pills all at once. Patient not taking: Reported on 04/30/2014 08/25/12   Reuben Likesavid C Keller, MD  mupirocin ointment (BACTROBAN) 2 % Apply to nostrils TID for 1 month Patient not taking: Reported on 04/30/2014 03/08/14   Reuben Likesavid C Keller, MD  ondansetron (ZOFRAN ODT) 8 MG disintegrating tablet Take 1 tablet (8 mg total) by mouth every 8 (eight) hours as needed for nausea. Patient not taking: Reported on 04/30/2014 08/25/12   Reuben Likesavid C Keller, MD  sulfamethoxazole-trimethoprim (BACTRIM DS,SEPTRA DS) 800-160 MG per tablet Take 1 tablet by mouth 2 (two) times daily. Patient not taking: Reported on 04/30/2014 03/06/14   Reuben Likesavid C Keller, MD  traMADol (ULTRAM) 50 MG tablet Take 1 tablet (50 mg total) by mouth every 6 (six) hours as needed. Patient not taking: Reported on 04/30/2014 09/26/13   Roma KayserKatherine P Schorr, NP  zolpidem (AMBIEN) 5 MG tablet Take 1 tablet (5 mg total) by mouth at bedtime as needed for sleep. 04/30/14   Arby BarretteMarcy Jilliane Kazanjian, MD   BP 129/84 mmHg  Pulse 66  Temp(Src) 98 F (36.7 C) (Oral)  Resp 18  Ht 5\' 4"  (1.626 m)  Wt 241 lb 2 oz (109.374 kg)  BMI 41.37 kg/m2  SpO2 99% Physical Exam  Constitutional: She is oriented to person, place, and time. She appears well-developed and well-nourished.  HENT:  Head: Normocephalic and atraumatic.  Eyes: EOM are normal. Pupils are equal, round, and reactive to light.  Neck: Neck supple.  Cardiovascular: Normal rate, regular rhythm, normal heart sounds and intact distal pulses.   Pulmonary/Chest: Effort  normal and breath sounds normal.  Abdominal: Soft. Bowel sounds are normal. She exhibits no distension. There is no tenderness.  Musculoskeletal: Normal range of motion. She exhibits no edema.  Neurological: She is alert and oriented to person, place, and time. She has normal strength. Coordination normal. GCS eye subscore is 4. GCS verbal subscore is 5. GCS motor subscore is 6.  Skin: Skin is warm, dry and intact.  Psychiatric: She has a normal mood and affect.    ED Course  Procedures (including critical care time) Labs Review Labs Reviewed  COMPREHENSIVE METABOLIC PANEL - Abnormal; Notable for the following:    Glucose, Bld 111 (*)    Calcium 8.3 (*)    Albumin 3.2 (*)    Total Bilirubin 0.2 (*)    All other components within normal limits  CBC WITH  DIFFERENTIAL - Abnormal; Notable for the following:    Hemoglobin 10.8 (*)    HCT 33.9 (*)    All other components within normal limits  URINALYSIS, ROUTINE W REFLEX MICROSCOPIC  TSH  T4, FREE  POC URINE PREG, ED    Imaging Review No results found.   EKG Interpretation None      MDM   Final diagnoses:  Tremulousness  Syncope, near  Insomnia  Anxiety   At this time the patient is well in appearance. Her physical examination is normal. Her mental status is clear and she does not show any signs of decompensated psychiatric illness. She acknowledges anxiety but does not have a generally anxious appearance. She cites a desire to get counseling for management of those symptoms and general medical care. By clinical evaluation and diagnostic testing I do not think that her symptoms are due to an acute medical problem at this time. She shows no signs of any psychotic features or acute mania. The patient has suffered from insomnia for years she reports. At this time I do suspect a large contributing factor is a work schedule that keeps her awake late at night and some degree of caffeine consumption and poor sleep hygiene. The patient  will be given a small quantity of Ambien to help her acutely with sleep disorder. She is given the resources to start pursuing a counselor and a family practice doctor.    Arby Barrette, MD 04/30/14 [phone]

## 2014-04-30 NOTE — ED Notes (Signed)
Patient has multiple complaints.  She is also reporting intermittent stomach pain and hand swelling

## 2014-05-07 ENCOUNTER — Encounter (HOSPITAL_COMMUNITY): Payer: Self-pay | Admitting: Emergency Medicine

## 2014-05-07 ENCOUNTER — Emergency Department (HOSPITAL_COMMUNITY)
Admission: EM | Admit: 2014-05-07 | Discharge: 2014-05-08 | Disposition: A | Discharge status: Home / Self Care | Payer: Self-pay | Attending: Emergency Medicine | Admitting: Emergency Medicine

## 2014-05-07 DIAGNOSIS — R531 Weakness: Secondary | ICD-10-CM | POA: Insufficient documentation

## 2014-05-07 DIAGNOSIS — E876 Hypokalemia: Secondary | ICD-10-CM | POA: Insufficient documentation

## 2014-05-07 DIAGNOSIS — J45909 Unspecified asthma, uncomplicated: Secondary | ICD-10-CM | POA: Insufficient documentation

## 2014-05-07 DIAGNOSIS — I10 Essential (primary) hypertension: Secondary | ICD-10-CM | POA: Insufficient documentation

## 2014-05-07 DIAGNOSIS — R5383 Other fatigue: Secondary | ICD-10-CM | POA: Insufficient documentation

## 2014-05-07 DIAGNOSIS — G4700 Insomnia, unspecified: Secondary | ICD-10-CM | POA: Insufficient documentation

## 2014-05-07 DIAGNOSIS — Z87891 Personal history of nicotine dependence: Secondary | ICD-10-CM | POA: Insufficient documentation

## 2014-05-07 NOTE — ED Provider Notes (Signed)
CSN: [id number]     Arrival date & time 05/07/14  2124 History  This chart was scribed for Joya Gaskinsonald W Coretha Creswell, MD by [place] Peace, ED Scribe. The patient was seen in A02C/A02C. The patient's care was started at 11:29 PM.     Chief Complaint  Patient presents with  . left sided weakness       Patient is a 44 y.o. female presenting with weakness. The history is provided by the patient. No language interpreter was used.  Weakness This is a new problem. The current episode started more than 1 week ago. The problem occurs constantly. The problem has not changed since onset.Associated symptoms include headaches. Pertinent negatives include no chest pain, no abdominal pain and no shortness of breath. She has tried nothing for the symptoms.    HPI Comments: Rebecca Stevenson is a 44 y.o. female who presents to the Emergency Department complaining of left sided weakness onset 1 week ago. Pt has history of insomnia, latest episode occurred on 04/24/2014. Pt reports she was up for 5 days straight without any sleep, longest episode she has ever experienced. Pt states that on the last day of being awake without any sleep, she reached out for her cup and passed out. Pt was then seen here at ED on 04/30/2014 and prescribed Ambien, but decided against taking it. Pt also complains of changes in her speech, headaches, and blurred vision. She states that she takes two shots of E&J liquor to make herself go to sleep because she does not want to take her sleeping pills. No complaints of fever, chest pain, abdominal pain, or vomiting. She further denies SI, HI, or hallucinations. History of stroke at age of 44. History of asthma and hypertension.    Past Medical History  Diagnosis Date  . Asthma   . Hypertension    History reviewed. No pertinent past surgical history. No family history on file. History  Substance Use Topics  . Smoking status: Former Games developermoker  . Smokeless tobacco: Not on file  . Alcohol Use: No    OB History    No data available     Review of Systems  Constitutional: Negative for fever.  Eyes: Positive for visual disturbance.  Respiratory: Negative for shortness of breath.   Cardiovascular: Negative for chest pain.  Gastrointestinal: Negative for vomiting and abdominal pain.  Neurological: Positive for syncope, speech difficulty, weakness and headaches.  Psychiatric/Behavioral: Positive for sleep disturbance. Negative for suicidal ideas and hallucinations.  All other systems reviewed and are negative.     Allergies  Septra  Home Medications   Prior to Admission medications   Medication Sig Start Date End Date Taking? Authorizing Provider  albuterol (PROVENTIL HFA;VENTOLIN HFA) 108 (90 BASE) MCG/ACT inhaler Inhale 2 puffs into the lungs every 6 (six) hours as needed. For asthma symptoms    Historical Provider, MD  cyclobenzaprine (FLEXERIL) 10 MG tablet Take 1 tablet (10 mg total) by mouth 2 (two) times daily as needed for muscle spasms. Patient not taking: Reported on 04/30/2014 09/26/13   Roma KayserKatherine P Schorr, NP  Multiple Vitamins-Minerals (WOMENS MULTIVITAMIN PLUS) TABS Take 1 tablet by mouth daily.    Historical Provider, MD   BP 113/78 mmHg  Pulse 71  Temp(Src) 98.3 F (36.8 C) (Oral)  Resp 18  Ht 5\' 4"  (1.626 m)  Wt 240 lb (108.863 kg)  BMI 41.18 kg/m2  SpO2 100%  LMP 05/07/2014 (Exact Date) Physical Exam  CONSTITUTIONAL: Well developed/well nourished HEAD: Normocephalic/atraumatic EYES: EOMI/PERRL, no  nystagmus,no ptosis.  ENMT: Mucous membranes moist NECK: supple no meningeal signs, no bruits CV: S1/S2 noted, no murmurs/rubs/gallops noted LUNGS: Lungs are clear to auscultation bilaterally, no apparent distress ABDOMEN: soft, nontender, no rebound or guarding GU:no cva tenderness NEURO:Awake/alert, facies symmetric, no arm or leg drift is noted Equal 5/5 strength with shoulder abduction, elbow flex/extension, wrist flex/extension in upper extremities   Equal 5/5 strength with hip flexion,knee flex/extension, foot dorsi/plantar flexion Cranial nerves 3/4/5/6/10/29/08/11/12 tested and intact Gait normal without ataxia No past pointing Sensation to light touch intact in all extremities EXTREMITIES: pulses normal, full ROM SKIN: warm, color normal PSYCH: no abnormalities of mood noted    ED Course  Procedures   12:31 AM I have low suspicion for CVA,  No focal weakness.  Speech is fluent.  However she reports symptoms for a week.  Screening CT head ordered.  Previous labs/ED notes reviewed from recent ED visit   Imaging negative Pt well appearing, no distress, resting comfortably Appropriate for d/c home   Labs Review Labs Reviewed  I-STAT CHEM 8, ED - Abnormal; Notable for the following:    Potassium 3.3 (*)    Glucose, Bld 105 (*)    Hemoglobin 11.2 (*)    HCT 33.0 (*)    All other components within normal limits    Imaging Review Ct Head Wo Contrast  05/08/2014   CLINICAL DATA:  Acute onset of left-sided weakness. Prolonged insomnia. Stuttering. Initial encounter.  EXAM: CT HEAD WITHOUT CONTRAST  TECHNIQUE: Contiguous axial images were obtained from the base of the skull through the vertex without intravenous contrast.  COMPARISON:  CT of the head performed 02/03/2009  FINDINGS: There is no evidence of acute infarction, mass lesion, or intra- or extra-axial hemorrhage on CT.  The posterior fossa, including the cerebellum, brainstem and fourth ventricle, is within normal limits. The third and lateral ventricles, and basal ganglia are unremarkable in appearance. The cerebral hemispheres are symmetric in appearance, with normal gray-white differentiation. No mass effect or midline shift is seen.  There is no evidence of fracture; visualized osseous structures are unremarkable in appearance. The orbits are within normal limits. The paranasal sinuses and mastoid air cells are well-aerated. No significant soft tissue abnormalities are  seen.  IMPRESSION: Unremarkable noncontrast CT of the head.   Electronically Signed   By: Roanna Raider M.D.   On: 05/08/2014 02:10     EKG Interpretation  Date/Time:  Friday May 07 2014 21:29:46 EST Ventricular Rate:  74 PR Interval:  140 QRS Duration: 68 QT Interval:  420 QTC Calculation: 466 R Axis:   59 Text Interpretation:  Normal sinus rhythm Normal ECG No significant change since last tracing Confirmed by Bebe Shaggy  MD, Avalene Sealy ([id number]) on 05/07/2014 11:42:49 PM         MDM   Final diagnoses:  Weakness  Hypokalemia  Insomnia  Other fatigue    Nursing notes including past medical history and social history reviewed and considered in documentation Previous records reviewed and considered Labs/vital reviewed myself and considered during evaluation   I personally performed the services described in this documentation, which was scribed in my presence. The recorded information has been reviewed and is accurate.   Joya Gaskins, MD 05/08/14 [phone]

## 2014-05-07 NOTE — ED Notes (Signed)
Patient hasn't arrived yet.

## 2014-05-07 NOTE — ED Notes (Addendum)
States I was seen on the 8th and diagnosed with tremulousness, syncope, insomnia and anxiety.  States I have been having left sided weakness since and my speech just isn't right.  I don't usually talk like this.  Walked into triage area without difficulty.  States I'm not having any pain but I took a flexeril today and it helped by tremors.

## 2014-05-08 ENCOUNTER — Emergency Department (HOSPITAL_COMMUNITY): Payer: Self-pay

## 2014-05-08 LAB — I-STAT CHEM 8, ED
BUN: 13 mg/dL (ref 6–23)
CHLORIDE: 102 meq/L (ref 96–112)
Calcium, Ion: 1.13 mmol/L (ref 1.12–1.23)
Creatinine, Ser: 0.6 mg/dL (ref 0.50–1.10)
GLUCOSE: 105 mg/dL — AB (ref 70–99)
HCT: 33 % — ABNORMAL LOW (ref 36.0–46.0)
Hemoglobin: 11.2 g/dL — ABNORMAL LOW (ref 12.0–15.0)
Potassium: 3.3 mmol/L — ABNORMAL LOW (ref 3.5–5.1)
Sodium: 142 mmol/L (ref 135–145)
TCO2: 25 mmol/L (ref 0–100)

## 2014-05-08 MED ORDER — POTASSIUM CHLORIDE CRYS ER 20 MEQ PO TBCR
40.0000 meq | EXTENDED_RELEASE_TABLET | Freq: Once | ORAL | Status: AC
  Administered 2014-05-08: 40 meq via ORAL
  Filled 2014-05-08: qty 2

## 2014-05-08 NOTE — ED Notes (Signed)
Pt states she was dx with nervous twitch, insomnia and anxiety last night and sx have not been getting any better. Pt states her left hand and left leg is weak and her balance is off. Pt is agitated in bed, unable to sit still. Pt states she is tired but can't sleep. Pt was prescribed ambien for sleep but states she didn't get it filled due to not knowing the side effects.

## 2014-05-08 NOTE — Discharge Instructions (Signed)

## 2014-05-14 ENCOUNTER — Ambulatory Visit: Payer: Self-pay | Admitting: Internal Medicine

## 2014-05-21 ENCOUNTER — Encounter: Payer: Self-pay | Admitting: Internal Medicine

## 2014-05-21 ENCOUNTER — Ambulatory Visit: Payer: Self-pay | Attending: Internal Medicine | Admitting: Internal Medicine

## 2014-05-21 VITALS — BP 124/85 | HR 80 | Temp 98.0°F | Resp 16 | Wt 248.2 lb

## 2014-05-21 DIAGNOSIS — F8081 Childhood onset fluency disorder: Secondary | ICD-10-CM

## 2014-05-21 DIAGNOSIS — Z833 Family history of diabetes mellitus: Secondary | ICD-10-CM

## 2014-05-21 DIAGNOSIS — F411 Generalized anxiety disorder: Secondary | ICD-10-CM

## 2014-05-21 DIAGNOSIS — M6289 Other specified disorders of muscle: Secondary | ICD-10-CM

## 2014-05-21 DIAGNOSIS — M62838 Other muscle spasm: Secondary | ICD-10-CM

## 2014-05-21 DIAGNOSIS — R531 Weakness: Secondary | ICD-10-CM

## 2014-05-21 DIAGNOSIS — Z139 Encounter for screening, unspecified: Secondary | ICD-10-CM

## 2014-05-21 LAB — COMPLETE METABOLIC PANEL WITH GFR
ALBUMIN: 3.7 g/dL (ref 3.5–5.2)
ALK PHOS: 65 U/L (ref 39–117)
ALT: 13 U/L (ref 0–35)
AST: 14 U/L (ref 0–37)
BUN: 9 mg/dL (ref 6–23)
CHLORIDE: 101 meq/L (ref 96–112)
CO2: 32 mEq/L (ref 19–32)
Calcium: 8.7 mg/dL (ref 8.4–10.5)
Creat: 0.57 mg/dL (ref 0.50–1.10)
GFR, Est Non African American: >89 mL/min
GLUCOSE: 114 mg/dL — AB (ref 70–99)
POTASSIUM: 3.9 meq/L (ref 3.5–5.3)
SODIUM: 137 meq/L (ref 135–145)
TOTAL PROTEIN: 6.6 g/dL (ref 6.0–8.3)
Total Bilirubin: 0.2 mg/dL (ref 0.2–1.2)

## 2014-05-21 LAB — CBC WITH DIFFERENTIAL/PLATELET
Basophils Absolute: 0 10*3/uL (ref 0.0–0.1)
Basophils Relative: 0 % (ref 0–1)
Eosinophils Absolute: 0 10*3/uL (ref 0.0–0.7)
Eosinophils Relative: 1 % (ref 0–5)
HEMATOCRIT: 34 % — AB (ref 36.0–46.0)
HEMOGLOBIN: 11 g/dL — AB (ref 12.0–15.0)
Lymphocytes Relative: 32 % (ref 12–46)
Lymphs Abs: 1.5 10*3/uL (ref 0.7–4.0)
MCH: 27.4 pg (ref 26.0–34.0)
MCHC: 32.4 g/dL (ref 30.0–36.0)
MCV: 84.8 fL (ref 78.0–100.0)
MONO ABS: 0.5 10*3/uL (ref 0.1–1.0)
MPV: 9.7 fL (ref 8.6–12.4)
Monocytes Relative: 10 % (ref 3–12)
NEUTROS PCT: 57 % (ref 43–77)
Neutro Abs: 2.7 10*3/uL (ref 1.7–7.7)
PLATELETS: 222 10*3/uL (ref 150–400)
RBC: 4.01 MIL/uL (ref 3.87–5.11)
RDW: 14.4 % (ref 11.5–15.5)
WBC: 4.7 10*3/uL (ref 4.0–10.5)

## 2014-05-21 MED ORDER — SERTRALINE HCL 50 MG PO TABS: 50.0000 mg | ORAL_TABLET | Freq: Every day | ORAL | Status: DC

## 2014-05-21 MED ORDER — CYCLOBENZAPRINE HCL 10 MG PO TABS: 10.0000 mg | ORAL_TABLET | Freq: Every day | ORAL | Status: DC

## 2014-05-21 NOTE — Progress Notes (Signed)
Patient Demographics  Rebecca Stevenson, is a 44 y.o. female  WUJ:[id number]:[id number]  NFA:[id number]  DOB - 1971-04-16  CC:  Chief Complaint  Patient presents with  . Anxiety       HPI: Rebecca Stevenson is a 44 y.o. female here today to establish medical care.she has history of anxiety, insomnia, recently went to the emergency room with symptoms of left-sided weakness stuttering  insomnia, EMR reviewed patient had a CT scan of head  done which was unremarkable, patient had been prescribed Ambien the past but has not taken the medication because she is afraid of side effects, patient is still concerned about stuttering, also feels she is weak on the left side, the blood work reviewed noticed low potassium level, she would like to try something for anxiety and also having lot of muscle cramps Patient has No headache, No chest pain, No abdominal pain - No Nausea, No new weakness tingling or numbness, No Cough - SOB.  Allergies  Allergen Reactions  . Septra [Sulfamethoxazole-Trimethoprim] Itching    Itching, fatigue, asthma flare up, tiredness, blood in urine.   Past Medical History  Diagnosis Date  . Asthma   . Hypertension   . Anxiety   . Insomnia   . Speech impediment   . Muscle spasm    Current Outpatient Prescriptions on File Prior to Visit  Medication Sig Dispense Refill  . albuterol (PROVENTIL HFA;VENTOLIN HFA) 108 (90 BASE) MCG/ACT inhaler Inhale 2 puffs into the lungs every 6 (six) hours as needed. For asthma symptoms    . Multiple Vitamins-Minerals (WOMENS MULTIVITAMIN PLUS) TABS Take 1 tablet by mouth daily.     No current facility-administered medications on file prior to visit.   Family History  Problem Relation Age of Onset  . Diabetes Father   . Hypertension Father   . Heart disease Father   . Stroke Father   . Cancer Sister   . Diabetes Maternal Grandmother   . Cancer Maternal Grandmother   . Hypertension Maternal Grandfather   . Hypertension Paternal  Grandfather    History   Social History  . Marital Status: Single    Spouse Name: N/A    Number of Children: N/A  . Years of Education: N/A   Occupational History  . Not on file.   Social History Main Topics  . Smoking status: Former Games developermoker  . Smokeless tobacco: Not on file  . Alcohol Use: No  . Drug Use: No     Comment: Quit july 24th 2013  . Sexual Activity: Not on file   Other Topics Concern  . Not on file   Social History Narrative    Review of Systems: Constitutional: Negative for fever, chills, diaphoresis, activity change, appetite change and fatigue. HENT: Negative for ear pain, nosebleeds, congestion, facial swelling, rhinorrhea, neck pain, neck stiffness and ear discharge.  Eyes: Negative for pain, discharge, redness, itching and visual disturbance. Respiratory: Negative for cough, choking, chest tightness, shortness of breath, wheezing and stridor.  Cardiovascular: Negative for chest pain, palpitations and leg swelling. Gastrointestinal: Negative for abdominal distention. Genitourinary: Negative for dysuria, urgency, frequency, hematuria, flank pain, decreased urine volume, difficulty urinating and dyspareunia.  Musculoskeletal: Negative for back pain, joint swelling, arthralgia and gait problem. Neurological: Negative for dizziness, tremors, seizures, syncope, facial asymmetry, speech difficulty, weakness, light-headedness, numbness and headaches.  Hematological: Negative for adenopathy. Does not bruise/bleed easily. Psychiatric/Behavioral: Negative for hallucinations, behavioral problems, confusion, dysphoric mood, decreased concentration and agitation.    Objective:  Filed Vitals:   05/21/14 1014  BP: 124/85  Pulse: 80  Temp: 98 F (36.7 C)  Resp: 16    Physical Exam: Constitutional: Patient appears well-developed and well-nourished. No distress. HENT: Normocephalic, atraumatic, External right and left ear normal. Oropharynx is clear and moist.    Eyes: Conjunctivae and EOM are normal. PERRLA, no scleral icterus. Neck: Normal ROM. Neck supple. No JVD. No tracheal deviation. No thyromegaly. CVS: RRR, S1/S2 +, no murmurs, no gallops, no carotid bruit.  Pulmonary: Effort and breath sounds normal, no stridor, rhonchi, wheezes, rales.  Abdominal: Soft. BS +, no distension, tenderness, rebound or guarding.  Musculoskeletal: Normal range of motion. No edema and no tenderness.  Neuro: Alert. Normal reflexes, muscle tone coordination. No cranial nerve deficit. Skin: Skin is warm and dry. No rash noted. Not diaphoretic. No erythema. No pallor. Psychiatric: Normal mood and affect. Behavior, judgment, thought content normal.  Lab Results  Component Value Date   WBC 5.7 04/30/2014   HGB 11.2* 05/08/2014   HCT 33.0* 05/08/2014   MCV 86.5 04/30/2014   PLT 210 04/30/2014   Lab Results  Component Value Date   CREATININE 0.60 05/08/2014   BUN 13 05/08/2014   NA 142 05/08/2014   K 3.3* 05/08/2014   CL 102 05/08/2014   CO2 27 04/30/2014    No results found for: HGBA1C Lipid Panel  No results found for: CHOL, TRIG, HDL, CHOLHDL, VLDL, LDLCALC     Assessment and plan:   1. Anxiety state Started patient on Zoloft. - sertraline (ZOLOFT) 50 MG tablet; Take 1 tablet (50 mg total) by mouth daily.  Dispense: 30 tablet; Refill: 3  2. Muscle spasm  - cyclobenzaprine (FLEXERIL) 10 MG tablet; Take 1 tablet (10 mg total) by mouth at bedtime.  Dispense: 30 tablet; Refill: 3  3. Family history of diabetes mellitus (DM) We'll check - Hemoglobin A1c  4. Stuttering/5. Left-sided weakness Her CT scan was negative, I have ordered   - MR Brain Wo Contrast; Future  6. Screening Ordered baseline blood work. - COMPLETE METABOLIC PANEL WITH GFR - CBC with Differential/Platelet - TSH - Vit D  25 hydroxy (rtn osteoporosis monitoring) - MM DIGITAL SCREENING BILATERAL; Future     Health Maintenance : -Mammogram:ordered -Vaccinations:  Patient  declined flu shot  Return in about 3 months (around 08/20/2014), or if symptoms worsen or fail to improve.    Doris Cheadle, MD

## 2014-05-21 NOTE — Progress Notes (Signed)
Patient here to establish care Patient was seen twice in the ED in the last couple of weeks Patient complains of having trouble sleeping Having some weakness to her left arm Has a speech impediment where is she tries to talk to fast she starts to stutter Having some muscle spasms to her back Patient also states she suffers from anxiety

## 2014-05-22 LAB — TSH: TSH: 1.039 u[IU]/mL (ref 0.350–4.500)

## 2014-05-22 LAB — HEMOGLOBIN A1C
HEMOGLOBIN A1C: 6 % — AB (ref <5.7)
Mean Plasma Glucose: 126 mg/dL — ABNORMAL HIGH (ref <117)

## 2014-05-22 LAB — VITAMIN D 25 HYDROXY (VIT D DEFICIENCY, FRACTURES): VIT D 25 HYDROXY: 12 ng/mL — AB (ref 30–100)

## 2014-05-24 ENCOUNTER — Telehealth: Payer: Self-pay | Admitting: *Deleted

## 2014-05-24 MED ORDER — VITAMIN D (ERGOCALCIFEROL) 1.25 MG (50000 UNIT) PO CAPS: 50000.0000 [IU] | ORAL_CAPSULE | ORAL | Status: DC

## 2014-05-24 NOTE — Telephone Encounter (Signed)
Rx send to [facility] Pt aware of lab results

## 2014-05-24 NOTE — Telephone Encounter (Signed)
-----   Message from Deepak Advani, MD sent at 05/24/2014  9:28 AM EST ----- Blood work reviewed, noticed low vitamin D, call patient advise to start ergocalciferol 50,000 units once a week for the duration of  12 weeks.  noticed hemoglobin A1c of 6.0%, patient has prediabetes, call and advise patient for low carbohydrate diet. Patient has borderline anemia, advise to start taking Over-the-counter of iron supplements daily.   

## 2014-05-26 ENCOUNTER — Telehealth: Payer: Self-pay

## 2014-05-26 NOTE — Telephone Encounter (Signed)
Spoke with patient and she was informed yesterday

## 2014-05-26 NOTE — Telephone Encounter (Signed)
-----   Message from Doris Cheadleeepak Advani, MD sent at 05/24/2014  9:28 AM EST ----- Blood work reviewed, noticed low vitamin D, call patient advise to start ergocalciferol 50,000 units once a week for the duration of  12 weeks.  noticed hemoglobin A1c of 6.0%, patient has prediabetes, call and advise patient for low carbohydrate diet. Patient has borderline anemia, advise to start taking Over-the-counter of iron supplements daily.

## 2014-05-27 ENCOUNTER — Telehealth: Payer: Self-pay | Admitting: Internal Medicine

## 2014-05-27 DIAGNOSIS — M62838 Other muscle spasm: Secondary | ICD-10-CM

## 2014-05-31 ENCOUNTER — Ambulatory Visit (HOSPITAL_COMMUNITY): Payer: Self-pay | Attending: Internal Medicine

## 2014-06-07 ENCOUNTER — Ambulatory Visit (HOSPITAL_COMMUNITY)
Admission: RE | Admit: 2014-06-07 | Discharge: 2014-06-07 | Disposition: A | Discharge status: Home / Self Care | Payer: Self-pay | Source: Ambulatory Visit | Attending: Internal Medicine | Admitting: Internal Medicine

## 2014-06-07 DIAGNOSIS — F8081 Childhood onset fluency disorder: Secondary | ICD-10-CM

## 2014-06-07 DIAGNOSIS — R531 Weakness: Secondary | ICD-10-CM

## 2014-06-08 ENCOUNTER — Telehealth: Payer: Self-pay | Admitting: Internal Medicine

## 2014-06-08 NOTE — Telephone Encounter (Signed)
Patient called stating that she had an MRI scheduled yesterday but she is really claustrophobic and could not do the exam. Radiology stated that she would need sedation to control her panic attacks before she can schedule another MRI. Please f/u with patient.

## 2014-06-11 ENCOUNTER — Ambulatory Visit (HOSPITAL_COMMUNITY): Admission: RE | Admit: 2014-06-11 | Payer: Self-pay | Source: Ambulatory Visit

## 2014-06-18 ENCOUNTER — Telehealth: Payer: Self-pay | Admitting: Internal Medicine

## 2014-06-18 NOTE — Telephone Encounter (Signed)
Patient has dropped off FMLA paperwork to be completed by PCP;

## 2014-06-18 NOTE — Telephone Encounter (Signed)
Expand All Collapse All   Patient has dropped off FMLA paperwork to be completed by PCP;           Lorain ChildesFYI

## 2014-06-21 ENCOUNTER — Telehealth: Payer: Self-pay | Admitting: Emergency Medicine

## 2014-06-21 NOTE — Telephone Encounter (Signed)
Pt informed Dr. Orpah CobbAdvani will be out of office for 1 week. Pt informed, she should anticipate completion 07/02/2014 Pt states she had panic attack while getting MRI brain done. Pt given [facility] imaging number for open MRI

## 2014-06-21 NOTE — Telephone Encounter (Signed)
Please inform patient Dr. Orpah Cobbdvani is out of office but will address FMLA upon his return next week. She should anticipate having paperwork by 07/02/14 and should call if she has not heard anything by then.

## 2014-06-28 ENCOUNTER — Ambulatory Visit: Payer: Self-pay | Admitting: Neurology

## 2014-07-03 ENCOUNTER — Ambulatory Visit: Admission: RE | Admit: 2014-07-03 | Payer: Self-pay | Source: Ambulatory Visit

## 2014-07-03 ENCOUNTER — Ambulatory Visit
Admission: RE | Admit: 2014-07-03 | Discharge: 2014-07-03 | Disposition: A | Discharge status: Home / Self Care | Payer: Self-pay | Source: Ambulatory Visit | Attending: Internal Medicine | Admitting: Internal Medicine

## 2014-07-03 ENCOUNTER — Other Ambulatory Visit: Payer: Self-pay | Admitting: Internal Medicine

## 2014-07-03 DIAGNOSIS — R531 Weakness: Secondary | ICD-10-CM

## 2014-07-03 DIAGNOSIS — F8081 Childhood onset fluency disorder: Secondary | ICD-10-CM

## 2014-07-07 ENCOUNTER — Telehealth: Payer: Self-pay

## 2014-07-07 DIAGNOSIS — R9089 Other abnormal findings on diagnostic imaging of central nervous system: Secondary | ICD-10-CM

## 2014-07-07 NOTE — Telephone Encounter (Signed)
Patient is aware of her MRI results 

## 2014-07-07 NOTE — Telephone Encounter (Signed)
-----   Message from Doris Cheadleeepak Advani, MD sent at 07/05/2014 12:37 PM EDT ----- Call and let the patient know that her MRI does not report any acute abnormality but  has findings which are non specific, recommend evaluation by Neurology, put in the referral

## 2014-07-28 ENCOUNTER — Encounter: Payer: Self-pay | Admitting: Neurology

## 2014-07-28 ENCOUNTER — Ambulatory Visit (INDEPENDENT_AMBULATORY_CARE_PROVIDER_SITE_OTHER): Payer: Self-pay | Admitting: Neurology

## 2014-07-28 VITALS — BP 130/90 | HR 86 | Ht 64.0 in | Wt 244.2 lb

## 2014-07-28 DIAGNOSIS — F985 Adult onset fluency disorder: Secondary | ICD-10-CM

## 2014-07-28 DIAGNOSIS — F458 Other somatoform disorders: Secondary | ICD-10-CM

## 2014-07-28 DIAGNOSIS — R29898 Other symptoms and signs involving the musculoskeletal system: Secondary | ICD-10-CM

## 2014-07-28 NOTE — Patient Instructions (Signed)
1.  Referral to occupational therapy 2.  Continue to see your behavior therapist and psychiatrist as many of your symptoms could be coming from underlying mood disorder 3.  If your left sided weakness does not improve with therapy, call my office to schedule nerve conduction studies and needle electrode exam

## 2014-07-28 NOTE — Progress Notes (Signed)
[facility] Clinic Note - Initial Visit   Date: 07/28/2014   Rebecca Stevenson MRN: [id number] DOB: 22-Mar-1971   Dear Dr. Orpah Cobb:  Thank you for your kind referral of Rebecca Stevenson for consultation of stuttering, left sided weakness, and memory changes. Although her history is well known to you, please allow [place] to reiterate it for the purpose of our medical record. The patient was accompanied to the clinic by self.   History of Present Illness: Rebecca Stevenson is a 44 y.o. right-handed African American female with anxiety/depression presenting for evaluation of stuttering, left sided weakness, and memory changes.    She is a very poor historian and is extremely slow to respond to questions or simple commands.   In January 2016, she reports being awake for 6-days and collapsed on her bedroom floor and says that she was "out for 40-minutes".  She woke up because she was thirsty. Since then she has weakness of her left side.  She is unable to hold a pot with her left hand.  She has numbness/tingling involving the palm of her left hand.  She is not falling and walks independently. Around the same time, she also develops speech disturbance, reporting she stutters frequently.  She saw her PCP for these issues who ordered MRI brain which I personally reviewed and is within normal limits.  Sparse microvascular changes are most likely due to her history hypertension and headaches.    Three weeks ago, she saw psychiatry and was diagnosed with PTSD, bipolar disorder, schizophrenia, OCD and goes to group therapy each Friday. She is now taking risperdal 0.5mg  daily and increased zoloft to  daily.  She reports having a stroke at the age of 33, but was told she had Bell's palsy.  Out-side paper records, electronic medical record, and images have been reviewed where available and summarized as:  Lab Results  Component Value Date   TSH 1.039 05/21/2014    Lab Results  Component Value Date   HGBA1C 6.0* 05/21/2014   MRI brain wo contrast 07/03/2014:   1. No acute intracranial abnormality. 2. 5 or 6 scattered subcortical T2 hyperintensities are slightly greater than expected for age. The finding is nonspecific but can be seen in the setting of chronic microvascular ischemia, a demyelinating process such as multiple sclerosis, vasculitis, complicated migraine headaches, or as the sequelae of a prior infectious or inflammatory process.  XR cervical spine 12/04/2012: 1. No radiographic evidence of significant acute traumatic injury to the cervical spine.  XR lumbar spine 12/04/2012: IMPRESSION: 1. No acute radiographic abnormality of the lumbar spine to account for the patient's symptoms. 2. Mild multilevel degenerative disc disease and lumbar spondylosis, as above.  Past Medical History  Diagnosis Date  . Asthma   . Hypertension   . Anxiety   . Insomnia   . Speech impediment   . Muscle spasm     Past Surgical History  Procedure Laterality Date  . Tubal ligation       Medications:  Current Outpatient Prescriptions on File Prior to Visit  Medication Sig Dispense Refill  . albuterol (PROVENTIL HFA;VENTOLIN HFA) 108 (90 BASE) MCG/ACT inhaler Inhale 2 puffs into the lungs every 6 (six) hours as needed. For asthma symptoms    . cyclobenzaprine (FLEXERIL) 10 MG tablet Take 1 tablet (10 mg total) by mouth at bedtime. 30 tablet 3  . Multiple Vitamins-Minerals (WOMENS MULTIVITAMIN PLUS) TABS Take 1 tablet by mouth daily.    . sertraline (ZOLOFT)  50 MG tablet Take 1 tablet (50 mg total) by mouth daily. 30 tablet 3  . Vitamin D, Ergocalciferol, (DRISDOL) 50000 UNITS CAPS capsule Take 1 capsule (50,000 Units total) by mouth every 7 (seven) days. 12 capsule 0  . zolpidem (AMBIEN) 5 MG tablet Take 5 mg by mouth at bedtime as needed for sleep.     No current facility-administered medications on file prior to visit.    Allergies:   Allergies  Allergen Reactions  . Septra [Sulfamethoxazole-Trimethoprim] Itching    Itching, fatigue, asthma flare up, tiredness, blood in urine.    Family History: Family History  Problem Relation Age of Onset  . Diabetes Father   . Hypertension Father   . Heart disease Father   . Stroke Father   . Cancer Sister   . Diabetes Maternal Grandmother   . Cancer Maternal Grandmother   . Hypertension Maternal Grandfather   . Hypertension Paternal Grandfather   . Stuttering Father   . Mental illness Daughter     Social History: History   Social History  . Marital Status: Single    Spouse Name: N/A  . Number of Children: N/A  . Years of Education: N/A   Occupational History  . Not on file.   Social History Main Topics  . Smoking status: Former Games developer  . Smokeless tobacco: Never Used  . Alcohol Use: 0.0 oz/week    0 Standard drinks or equivalent per week     Comment: Occasional  . Drug Use: No     Comment: Quit july 24th 2013  . Sexual Activity: Not on file   Other Topics Concern  . Not on file   Social History Narrative   Lives with daughter and grandson in a two story home.  Does not work.  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  Education: some college.     Review of Systems:  CONSTITUTIONAL: No fevers, chills, night sweats, or weight loss.   EYES: No visual changes or eye pain ENT: No hearing changes.  No history of nose bleeds.   RESPIRATORY: No cough, wheezing and shortness of breath.   CARDIOVASCULAR: Negative for chest pain, and palpitations.   GI: Negative for abdominal discomfort, blood in stools or black stools.  No recent change in bowel habits.   GU:  No history of incontinence.   MUSCLOSKELETAL: No history of joint pain or swelling.  No myalgias.   SKIN: Negative for lesions, rash, and itching.   HEMATOLOGY/ONCOLOGY: Negative for prolonged bleeding, bruising easily, and swollen nodes.  No history of cancer.   ENDOCRINE: Negative for cold or heat  intolerance, polydipsia or goiter.   PSYCH:  ++depression or anxiety symptoms.   NEURO: As Above.   Vital Signs:  BP 130/90 mmHg  Pulse 86  Ht 5\' 4"  (1.626 m)  Wt 244 lb 3 oz (110.763 kg)  BMI 41.89 kg/m2  SpO2 97%  LMP 05/28/2014   General Medical Exam:   General:  Well appearing, comfortable.   Eyes/ENT: see cranial nerve examination.   Neck: No masses appreciated.  Full range of motion without tenderness.  No carotid bruits. Respiratory:  Clear to auscultation, good air entry bilaterally.   Cardiac:  Regular rate and rhythm, no murmur.   Extremities:  No deformities, edema, or skin discoloration.  Skin:  No rashes or lesions.  Neurological Exam: Montreal Cognitive Assessment  07/28/2014  Visuospatial/ Executive (0/5) 3  Naming (0/3) 2  Attention: Read list of digits (0/2) 2  Attention: Read list of letters (0/1) 1  Attention: Serial 7 subtraction starting at 100 (0/3) 3  Language: Repeat phrase (0/2) 2  Language : Fluency (0/1) 1  Abstraction (0/2) 1  Delayed Recall (0/5) 4  Orientation (0/6) 5  Total 24  Adjusted Score (based on education) 24    MENTAL STATUS including orientation to time, place, person, recent and remote memory, language, and fund of knowledge is fairly intact, she is very slow to respond and demonstrates poor attention and concentration.  Speech is not dysarthric, intermittent and distractible stuttering  CRANIAL NERVES: II:  No visual field defects.  Unremarkable fundi.   III-IV-VI: Pupils equal round and reactive to light.  Normal conjugate, extra-ocular eye movements in all directions of gaze.  No nystagmus.  No ptosis.   V:  Normal facial sensation.    VII:  Normal facial symmetry and movements.  VIII:  Normal hearing and vestibular function.   IX-X:  Normal palatal movement.   XI:  Normal shoulder shrug and head rotation.   XII:  Normal tongue strength and range of motion, no deviation or fasciculation.  MOTOR:  Intermittent tremulousness  of her fingers when outstretched, appears somewhat restless. No tremors when at rest. No pronator drift.  Tone is normal.  Accurate motor strength testing of the right > left side is limited by patient's effort and give way weakness, however is at least antigravity and able to resist. Motor testing is inconsistent.  MSRs:  Right                                                                 Left brachioradialis 2+  brachioradialis 2+  biceps 2+  biceps 2+  triceps 2+  triceps 2+  patellar 2+  patellar 2+  ankle jerk 2+  ankle jerk 2+  Hoffman no  Hoffman no  plantar response down  plantar response down   SENSORY:  Vibration splitting is present.  Patchy sensory loss involving the left side to pinprick and temperature.  Romberg's sign absent.   COORDINATION/GAIT: Normal finger-to- nose-finger and heel-to-shin.  Slowed movements with finger tapping bilaterally.  Able to rise from a chair without using arms.  Gait narrow based and stable. Tandem and stressed gait intact.    IMPRESSION: Mrs. Okey DupreCrawford is a 44 year old female presenting for evaluation of stuttering and left-sided weakness. Unfortunately, there are multiple nonphysiologic features on her exam making it very difficult to accurately assess her motor strength. I offered to perform electrodiagnostic testing of the left arm and leg to better characterize the nature of her symptoms, however patient deferred testing. I reviewed her MRI brain which does not show any evidence of demyelinating disease, however there is mild white matter ischemic changes which can be expected in patient's with history of headaches and hypertension.  There is a component of underlying psychiatric disease which she endorses. At this time, I would recommend continuing management of mood disorder and of the psychiatric disease.  For her left-sided weakness, I will refer her to occupational therapy.  There is no evidence of intracranial pathology except to explain her  stuttering, which is most likely behavioral in nature. Speech therapy can be done going forward if there is no improvement.  If weakness does not improve, she  can return for EMG testing and/or MRI of the cervical spine.    The duration of this appointment visit was 45 minutes of face-to-face time with the patient.  Greater than 50% of this time was spent in counseling, explanation of diagnosis, planning of further management, and coordination of care.   Thank you for allowing me to participate in patient's care.  If I can answer any additional questions, I would be pleased to do so.    Sincerely,    Shina Wass K. Allena Katz, DO

## 2014-09-06 ENCOUNTER — Ambulatory Visit: Payer: Self-pay

## 2014-09-08 ENCOUNTER — Ambulatory Visit: Payer: Self-pay | Admitting: Internal Medicine

## 2014-09-15 ENCOUNTER — Ambulatory Visit: Payer: Self-pay | Admitting: Internal Medicine

## 2014-09-21 ENCOUNTER — Ambulatory Visit: Payer: Self-pay | Admitting: Internal Medicine

## 2014-09-23 ENCOUNTER — Ambulatory Visit: Payer: Self-pay | Admitting: Internal Medicine

## 2014-09-27 ENCOUNTER — Ambulatory Visit: Payer: Self-pay | Admitting: Internal Medicine

## 2014-10-04 ENCOUNTER — Ambulatory Visit: Payer: Self-pay

## 2014-10-04 ENCOUNTER — Ambulatory Visit: Payer: Self-pay | Admitting: Internal Medicine

## 2014-10-12 ENCOUNTER — Ambulatory Visit: Payer: Self-pay | Attending: Internal Medicine | Admitting: Internal Medicine

## 2014-10-12 ENCOUNTER — Encounter: Payer: Self-pay | Admitting: Internal Medicine

## 2014-10-12 VITALS — BP 127/85 | HR 79 | Temp 98.0°F | Resp 15 | Wt 246.0 lb

## 2014-10-12 DIAGNOSIS — M6289 Other specified disorders of muscle: Secondary | ICD-10-CM

## 2014-10-12 DIAGNOSIS — R7309 Other abnormal glucose: Secondary | ICD-10-CM | POA: Insufficient documentation

## 2014-10-12 DIAGNOSIS — F39 Unspecified mood [affective] disorder: Secondary | ICD-10-CM | POA: Insufficient documentation

## 2014-10-12 DIAGNOSIS — Z8709 Personal history of other diseases of the respiratory system: Secondary | ICD-10-CM

## 2014-10-12 DIAGNOSIS — R531 Weakness: Secondary | ICD-10-CM | POA: Insufficient documentation

## 2014-10-12 DIAGNOSIS — Z87891 Personal history of nicotine dependence: Secondary | ICD-10-CM | POA: Insufficient documentation

## 2014-10-12 DIAGNOSIS — J45909 Unspecified asthma, uncomplicated: Secondary | ICD-10-CM | POA: Insufficient documentation

## 2014-10-12 DIAGNOSIS — R7303 Prediabetes: Secondary | ICD-10-CM

## 2014-10-12 DIAGNOSIS — M62838 Other muscle spasm: Secondary | ICD-10-CM | POA: Insufficient documentation

## 2014-10-12 DIAGNOSIS — G4700 Insomnia, unspecified: Secondary | ICD-10-CM | POA: Insufficient documentation

## 2014-10-12 DIAGNOSIS — F985 Adult onset fluency disorder: Secondary | ICD-10-CM | POA: Insufficient documentation

## 2014-10-12 MED ORDER — CYCLOBENZAPRINE HCL 10 MG PO TABS
10.0000 mg | ORAL_TABLET | Freq: Every day | ORAL | Status: DC
Start: 2014-10-12 — End: 2018-07-08

## 2014-10-12 MED ORDER — ALBUTEROL SULFATE HFA 108 (90 BASE) MCG/ACT IN AERS: 2.0000 | INHALATION_SPRAY | Freq: Four times a day (QID) | RESPIRATORY_TRACT | Status: DC | PRN

## 2014-10-12 NOTE — Progress Notes (Signed)
MRN: [id number] Name: Rebecca Stevenson  Sex: female Age: 44 y.o. DOB: 1970-05-15  Allergies: Septra  Chief Complaint  Patient presents with  . Follow-up    HPI: Patient is 44 y.o. female who history of left-sided weakness, abnormal MRI findings, adult stuttering  she already followed up with neurology and was recommended physical therapy, as per patient she also has seen the psychiatrist for anxiety/ mood disorder and her Zoloft dose was increased and she is also on Seroquel, she denies any acute symptoms she is scheduled to see a financial counselor so that she can go for physical therapy, she also reports prior history of asthma and she used albuterol when necessary and needs refill on that, previous blood work reviewed with the patient noticed hemoglobin A1c of 6.0%, patient has prediabetes, she's advised for low carbohydrate diet, also has low vitamin D, she needs refill on her Flexeril which helps her with muscle spasm.  Past Medical History  Diagnosis Date  . Asthma   . Hypertension   . Anxiety   . Insomnia   . Speech impediment   . Muscle spasm     Past Surgical History  Procedure Laterality Date  . Tubal ligation        Medication List       This list is accurate as of: 10/12/14 11:27 AM.  Always use your most recent med list.               albuterol 108 (90 BASE) MCG/ACT inhaler  Commonly known as:  PROVENTIL HFA;VENTOLIN HFA  Inhale 2 puffs into the lungs every 6 (six) hours as needed. For asthma symptoms     cyclobenzaprine 10 MG tablet  Commonly known as:  FLEXERIL  Take 1 tablet (10 mg total) by mouth at bedtime.     risperiDONE 0.5 MG tablet  Commonly known as:  RISPERDAL  Take 0.5 mg by mouth 2 (two) times daily.     sertraline 50 MG tablet  Commonly known as:  ZOLOFT  Take 1 tablet (50 mg total) by mouth daily.     Vitamin D (Ergocalciferol) 50000 UNITS Caps capsule  Commonly known as:  DRISDOL  Take 1 capsule (50,000 Units total) by  mouth every 7 (seven) days.     WOMENS MULTIVITAMIN PLUS Tabs  Take 1 tablet by mouth daily.     zolpidem 5 MG tablet  Commonly known as:  AMBIEN  Take 5 mg by mouth at bedtime as needed for sleep.        Meds ordered this encounter  Medications  . albuterol (PROVENTIL HFA;VENTOLIN HFA) 108 (90 BASE) MCG/ACT inhaler    Sig: Inhale 2 puffs into the lungs every 6 (six) hours as needed. For asthma symptoms    Dispense:  18 g    Refill:  3  . cyclobenzaprine (FLEXERIL) 10 MG tablet    Sig: Take 1 tablet (10 mg total) by mouth at bedtime.    Dispense:  30 tablet    Refill:  3     There is no immunization history on file for this patient.  Family History  Problem Relation Age of Onset  . Diabetes Father   . Hypertension Father   . Heart disease Father   . Stroke Father   . Cancer Sister   . Diabetes Maternal Grandmother   . Cancer Maternal Grandmother   . Hypertension Maternal Grandfather   . Hypertension Paternal Grandfather   . Stuttering Father   .  Mental illness Daughter     History  Substance Use Topics  . Smoking status: Former Games developer  . Smokeless tobacco: Never Used  . Alcohol Use: 0.0 oz/week    0 Standard drinks or equivalent per week     Comment: Occasional    Review of Systems   As noted in HPI  Filed Vitals:   10/12/14 1046  BP: 127/85  Pulse: 79  Temp: 98 F (36.7 C)  Resp: 15    Physical Exam  Physical Exam  Constitutional: No distress.  Eyes: EOM are normal. Pupils are equal, round, and reactive to light.  Neck: Neck supple.  Cardiovascular: Normal rate and regular rhythm.   Pulmonary/Chest: Breath sounds normal. No respiratory distress. She has no wheezes. She has no rales.  Musculoskeletal:  Strength 4/5 left lower extremity    CBC    Component Value Date/Time   WBC 4.7 05/21/2014 1111   RBC 4.01 05/21/2014 1111   HGB 11.0* 05/21/2014 1111   HCT 34.0* 05/21/2014 1111   PLT 222 05/21/2014 1111   MCV 84.8 05/21/2014 1111    LYMPHSABS 1.5 05/21/2014 1111   MONOABS 0.5 05/21/2014 1111   EOSABS 0.0 05/21/2014 1111   BASOSABS 0.0 05/21/2014 1111    CMP     Component Value Date/Time   NA 137 05/21/2014 1111   K 3.9 05/21/2014 1111   CL 101 05/21/2014 1111   CO2 32 05/21/2014 1111   GLUCOSE 114* 05/21/2014 1111   BUN 9 05/21/2014 1111   CREATININE 0.57 05/21/2014 1111   CREATININE 0.60 05/08/2014 0159   CALCIUM 8.7 05/21/2014 1111   PROT 6.6 05/21/2014 1111   ALBUMIN 3.7 05/21/2014 1111   AST 14 05/21/2014 1111   ALT 13 05/21/2014 1111   ALKPHOS 65 05/21/2014 1111   BILITOT 0.2 05/21/2014 1111   GFRNONAA >89 05/21/2014 1111   GFRNONAA >90 04/30/2014 1023   GFRAA >89 05/21/2014 1111   GFRAA >90 04/30/2014 1023    No results found for: CHOL  Lab Results  Component Value Date/Time   HGBA1C 6.0* 05/21/2014 11:11 AM    Lab Results  Component Value Date/Time   AST 14 05/21/2014 11:11 AM    Assessment and Plan  Left-sided weakness/Adult stuttering Has followed up with the neurology, recommended to get physical therapy, if not improved she will get EMG study done  Mood disorder Currently following up with psychiatrist and is on Zoloft and Seroquel.  Prediabetes Last hemoglobin A1c was 6.0%, she's advised for low carbohydrate diet, she would like to repeat the blood test on the following visit  Muscle spasm - Plan: cyclobenzaprine (FLEXERIL) 10 MG tablet  History of asthma - Plan: albuterol (PROVENTIL HFA;VENTOLIN HFA) 108 (90 BASE) MCG/ACT inhaler    Return in about 3 months (around 01/12/2015), or if symptoms worsen or fail to improve.   This note has been created with Education officer, environmental. Any transcriptional errors are unintentional.    Doris Cheadle, MD

## 2014-10-12 NOTE — Progress Notes (Signed)
Patient here  For follow up on her speech impediment Still having problems talking Patient states she was seen by mental health and diagnosed withocd, bi polar PTSD

## 2014-10-12 NOTE — Patient Instructions (Signed)
Diabetes Mellitus and Food It is important for you to manage your blood sugar (glucose) level. Your blood glucose level can be greatly affected by what you eat. Eating healthier foods in the appropriate amounts throughout the day at about the same time each day will help you control your blood glucose level. It can also help slow or prevent worsening of your diabetes mellitus. Healthy eating may even help you improve the level of your blood pressure and reach or maintain a healthy weight.  HOW CAN FOOD AFFECT ME? Carbohydrates Carbohydrates affect your blood glucose level more than any other type of food. Your dietitian will help you determine how many carbohydrates to eat at each meal and teach you how to count carbohydrates. Counting carbohydrates is important to keep your blood glucose at a healthy level, especially if you are using insulin or taking certain medicines for diabetes mellitus. Alcohol Alcohol can cause sudden decreases in blood glucose (hypoglycemia), especially if you use insulin or take certain medicines for diabetes mellitus. Hypoglycemia can be a life-threatening condition. Symptoms of hypoglycemia (sleepiness, dizziness, and disorientation) are similar to symptoms of having too much alcohol.  If your health care provider has given you approval to drink alcohol, do so in moderation and use the following guidelines:  Women should not have more than one drink per day, and men should not have more than two drinks per day. One drink is equal to:  12 oz of beer.  5 oz of wine.  1 oz of hard liquor.  Do not drink on an empty stomach.  Keep yourself hydrated. Have water, diet soda, or unsweetened iced tea.  Regular soda, juice, and other mixers might contain a lot of carbohydrates and should be counted. WHAT FOODS ARE NOT RECOMMENDED? As you make food choices, it is important to remember that all foods are not the same. Some foods have fewer nutrients per serving than other  foods, even though they might have the same number of calories or carbohydrates. It is difficult to get your body what it needs when you eat foods with fewer nutrients. Examples of foods that you should avoid that are high in calories and carbohydrates but low in nutrients include:  Trans fats (most processed foods list trans fats on the Nutrition Facts label).  Regular soda.  Juice.  Candy.  Sweets, such as cake, pie, doughnuts, and cookies.  Fried foods. WHAT FOODS CAN I EAT? Have nutrient-rich foods, which will nourish your body and keep you healthy. The food you should eat also will depend on several factors, including:  The calories you need.  The medicines you take.  Your weight.  Your blood glucose level.  Your blood pressure level.  Your cholesterol level. You also should eat a variety of foods, including:  Protein, such as meat, poultry, fish, tofu, nuts, and seeds (lean animal proteins are best).  Fruits.  Vegetables.  Dairy products, such as milk, cheese, and yogurt (low fat is best).  Breads, grains, pasta, cereal, rice, and beans.  Fats such as olive oil, trans fat-free margarine, canola oil, avocado, and olives. DOES EVERYONE WITH DIABETES MELLITUS HAVE THE SAME MEAL PLAN? Because every person with diabetes mellitus is different, there is not one meal plan that works for everyone. It is very important that you meet with a dietitian who will help you create a meal plan that is just right for you. Document Released: 01/04/2005 Document Revised: 04/14/2013 Document Reviewed: 03/06/2013 [employer] Patient Information 2015 [employer]. This   information is not intended to replace advice given to you by your health care provider. Make sure you discuss any questions you have with your health care provider.  

## 2014-10-29 ENCOUNTER — Ambulatory Visit: Payer: Self-pay

## 2014-11-25 ENCOUNTER — Ambulatory Visit: Payer: Self-pay | Attending: Internal Medicine

## 2014-12-08 ENCOUNTER — Other Ambulatory Visit (HOSPITAL_COMMUNITY)
Admission: RE | Admit: 2014-12-08 | Discharge: 2014-12-08 | Disposition: A | Discharge status: Home / Self Care | Payer: Self-pay | Source: Ambulatory Visit | Attending: Emergency Medicine | Admitting: Emergency Medicine

## 2014-12-08 ENCOUNTER — Encounter (HOSPITAL_COMMUNITY): Payer: Self-pay | Admitting: Emergency Medicine

## 2014-12-08 ENCOUNTER — Emergency Department (INDEPENDENT_AMBULATORY_CARE_PROVIDER_SITE_OTHER)
Admission: EM | Admit: 2014-12-08 | Discharge: 2014-12-08 | Disposition: A | Discharge status: Home / Self Care | Payer: Self-pay | Source: Home / Self Care | Attending: Emergency Medicine | Admitting: Emergency Medicine

## 2014-12-08 DIAGNOSIS — N760 Acute vaginitis: Secondary | ICD-10-CM | POA: Insufficient documentation

## 2014-12-08 DIAGNOSIS — R252 Cramp and spasm: Secondary | ICD-10-CM

## 2014-12-08 LAB — POCT I-STAT, CHEM 8
BUN: 14 mg/dL (ref 6–20)
CREATININE: 0.8 mg/dL (ref 0.44–1.00)
Calcium, Ion: 1.2 mmol/L (ref 1.12–1.23)
Chloride: 97 mmol/L — ABNORMAL LOW (ref 101–111)
Glucose, Bld: 101 mg/dL — ABNORMAL HIGH (ref 65–99)
HCT: 39 % (ref 36.0–46.0)
HEMOGLOBIN: 13.3 g/dL (ref 12.0–15.0)
POTASSIUM: 3.6 mmol/L (ref 3.5–5.1)
SODIUM: 140 mmol/L (ref 135–145)
TCO2: 30 mmol/L (ref 0–100)

## 2014-12-08 MED ORDER — POTASSIUM CHLORIDE CRYS ER 20 MEQ PO TBCR
40.0000 meq | EXTENDED_RELEASE_TABLET | Freq: Once | ORAL | Status: AC
  Administered 2014-12-08: 40 meq via ORAL

## 2014-12-08 MED ORDER — POTASSIUM CHLORIDE CRYS ER 20 MEQ PO TBCR
EXTENDED_RELEASE_TABLET | ORAL | Status: AC
  Filled 2014-12-08: qty 2

## 2014-12-08 MED ORDER — POTASSIUM CHLORIDE CRYS ER 20 MEQ PO TBCR: 20.0000 meq | EXTENDED_RELEASE_TABLET | Freq: Every day | ORAL | Status: DC

## 2014-12-08 NOTE — Discharge Instructions (Signed)
Your potassium is slightly low. This may be contributing to the muscle cramps. We gave you some potassium here. Take 1 potassium pill daily for the next week. Follow-up as needed.   Leg Cramps Leg cramps that occur during exercise can be caused by poor circulation or dehydration. However, muscle cramps that occur at rest or during the night are usually not due to any serious medical problem. Heat cramps may cause muscle spasms during hot weather.  CAUSES There is no clear cause for muscle cramps. However, dehydration may be a factor for those who do not drink enough fluids and those who exercise in the heat. Imbalances in the level of sodium, potassium, calcium or magnesium in the muscle tissue may also be a factor. Some medications, such as water pills (diuretics), may cause loss of chemicals that the body needs (like sodium and potassium) and cause muscle cramps. TREATMENT   Make sure your diet has enough fluids and essential minerals for the muscle to work normally.  Avoid strenuous exercise for several days if you have been having frequent leg cramps.  Stretch and massage the cramped muscle for several minutes.  Some medicines may be helpful in some patients with night cramps. Only take over-the-counter or prescription medicines as directed by your caregiver. SEEK IMMEDIATE MEDICAL CARE IF:   Your leg cramps become worse.  Your foot becomes cold, numb, or blue. Document Released: 05/17/2004 Document Revised: 07/02/2011 Document Reviewed: 05/04/2008 [facility] Patient Information 2015 [employer], [place]. This information is not intended to replace advice given to you by your health care provider. Make sure you discuss any questions you have with your health care provider.

## 2014-12-08 NOTE — ED Notes (Signed)
Pt has had a cramp in her right calf close to her knee for about one week.  Pt states she has tried massages and warm compresses but feels like that has made it worse.  Pt is also suffering from some abdominal cramping that she is not sure is from her oncoming menstrual cycle or for a not properly treated trichomonas and yeast infection. She has no symptoms except for the abdominal cramping.

## 2014-12-08 NOTE — ED Provider Notes (Signed)
CSN: [id number]     Arrival date & time 12/08/14  1843 History   First MD Initiated Contact with Patient 12/08/14 1908     Chief Complaint  Patient presents with  . Abdominal Cramping  . Leg Pain   (Consider location/radiation/quality/duration/timing/severity/associated sxs/prior Treatment) HPI  She is a 44 year old woman here for evaluation of right leg cramping. She states for the last week she has had a constant cramp in her right calf. She states this has happened before, but it typically resolves with massage. She has tried massage, heat, Tylenol, ibuprofen, Excedrin without improvement. She states there is a knot there that is very tender. She denies any swelling in her leg. No recent car trips or plane rides. She is not on any hormones. She also reports some abdominal cramps. She was treated for trichomonas about a month ago. She states she accidentally wash the last 2 days of the antibiotics; she is concerned that the infection did not resolve.  No discharge.  Past Medical History  Diagnosis Date  . Asthma   . Hypertension   . Anxiety   . Insomnia   . Speech impediment   . Muscle spasm    Past Surgical History  Procedure Laterality Date  . Tubal ligation     Family History  Problem Relation Age of Onset  . Diabetes Father   . Hypertension Father   . Heart disease Father   . Stroke Father   . Cancer Sister   . Diabetes Maternal Grandmother   . Cancer Maternal Grandmother   . Hypertension Maternal Grandfather   . Hypertension Paternal Grandfather   . Stuttering Father   . Mental illness Daughter    Social History  Substance Use Topics  . Smoking status: Former Games developer  . Smokeless tobacco: Never Used  . Alcohol Use: 0.0 oz/week    0 Standard drinks or equivalent per week     Comment: Occasional   OB History    No data available     Review of Systems As in history of present illness Allergies  Septra  Home Medications   Prior to Admission medications    Medication Sig Start Date End Date Taking? Authorizing Provider  albuterol (PROVENTIL HFA;VENTOLIN HFA) 108 (90 BASE) MCG/ACT inhaler Inhale 2 puffs into the lungs every 6 (six) hours as needed. For asthma symptoms 10/12/14  Yes Doris Cheadle, MD  risperiDONE (RISPERDAL) 0.5 MG tablet Take 0.5 mg by mouth 2 (two) times daily.   Yes Historical Provider, MD  sertraline (ZOLOFT) 50 MG tablet Take 1 tablet (50 mg total) by mouth daily. 05/21/14  Yes Doris Cheadle, MD  cyclobenzaprine (FLEXERIL) 10 MG tablet Take 1 tablet (10 mg total) by mouth at bedtime. 10/12/14   Doris Cheadle, MD  Multiple Vitamins-Minerals (WOMENS MULTIVITAMIN PLUS) TABS Take 1 tablet by mouth daily.    Historical Provider, MD  potassium chloride SA (K-DUR,KLOR-CON) 20 MEQ tablet Take 1 tablet (20 mEq total) by mouth daily. 12/08/14   Charm Rings, MD  Vitamin D, Ergocalciferol, (DRISDOL) 50000 UNITS CAPS capsule Take 1 capsule (50,000 Units total) by mouth every 7 (seven) days. 05/24/14   Doris Cheadle, MD  zolpidem (AMBIEN) 5 MG tablet Take 5 mg by mouth at bedtime as needed for sleep.    Historical Provider, MD   BP 125/86 mmHg  Pulse 85  Temp(Src) 97.6 F (36.4 C) (Oral)  Resp 16  SpO2 97%  LMP 11/11/2014 (Exact Date) Physical Exam  Constitutional: She is oriented to  person, place, and time. She appears well-developed and well-nourished. No distress.  Cardiovascular: Normal rate.   Pulmonary/Chest: Effort normal.  Musculoskeletal:  Right leg: No edema. 2+ DP pulse. She does have a tender knot in the mid calf. This is quite tender to palpation.  Neurological: She is alert and oriented to person, place, and time.    ED Course  Procedures (including critical care time) Labs Review Labs Reviewed  POCT I-STAT, CHEM 8 - Abnormal; Notable for the following:    Chloride 97 (*)    Glucose, Bld 101 (*)    All other components within normal limits  URINE CYTOLOGY ANCILLARY ONLY    Imaging Review No results found.   MDM    1. Cramp in lower leg    Potassium is borderline at 3.6. 40 mEq of Kdur given here. No erythema or edema to suggest DVT. Discharge with one week of potassium supplementation. Follow-up as needed.    Charm Rings, MD 12/08/14 [phone]

## 2014-12-09 LAB — URINE CYTOLOGY ANCILLARY ONLY: Trichomonas: NEGATIVE

## 2014-12-09 NOTE — ED Notes (Signed)
Urine cytology negative for trichomonas, GC,or chlamydia

## 2015-03-02 ENCOUNTER — Encounter (HOSPITAL_COMMUNITY): Payer: Self-pay | Admitting: *Deleted

## 2015-03-02 ENCOUNTER — Emergency Department (HOSPITAL_COMMUNITY)
Admission: EM | Admit: 2015-03-02 | Discharge: 2015-03-02 | Disposition: A | Discharge status: Home / Self Care | Payer: Self-pay | Attending: Emergency Medicine | Admitting: Emergency Medicine

## 2015-03-02 ENCOUNTER — Emergency Department (INDEPENDENT_AMBULATORY_CARE_PROVIDER_SITE_OTHER)
Admission: EM | Admit: 2015-03-02 | Discharge: 2015-03-02 | Disposition: A | Discharge status: Home / Self Care | Payer: Self-pay | Source: Home / Self Care | Attending: Family Medicine | Admitting: Family Medicine

## 2015-03-02 ENCOUNTER — Encounter (HOSPITAL_COMMUNITY): Payer: Self-pay | Admitting: Emergency Medicine

## 2015-03-02 DIAGNOSIS — F419 Anxiety disorder, unspecified: Secondary | ICD-10-CM | POA: Insufficient documentation

## 2015-03-02 DIAGNOSIS — Y998 Other external cause status: Secondary | ICD-10-CM | POA: Insufficient documentation

## 2015-03-02 DIAGNOSIS — Z041 Encounter for examination and observation following transport accident: Secondary | ICD-10-CM

## 2015-03-02 DIAGNOSIS — Y9389 Activity, other specified: Secondary | ICD-10-CM | POA: Insufficient documentation

## 2015-03-02 DIAGNOSIS — J45909 Unspecified asthma, uncomplicated: Secondary | ICD-10-CM | POA: Insufficient documentation

## 2015-03-02 DIAGNOSIS — S339XXA Sprain of unspecified parts of lumbar spine and pelvis, initial encounter: Secondary | ICD-10-CM | POA: Insufficient documentation

## 2015-03-02 DIAGNOSIS — I10 Essential (primary) hypertension: Secondary | ICD-10-CM | POA: Insufficient documentation

## 2015-03-02 DIAGNOSIS — Z87891 Personal history of nicotine dependence: Secondary | ICD-10-CM | POA: Insufficient documentation

## 2015-03-02 DIAGNOSIS — G4700 Insomnia, unspecified: Secondary | ICD-10-CM | POA: Insufficient documentation

## 2015-03-02 DIAGNOSIS — Y92410 Unspecified street and highway as the place of occurrence of the external cause: Secondary | ICD-10-CM | POA: Insufficient documentation

## 2015-03-02 DIAGNOSIS — Z79899 Other long term (current) drug therapy: Secondary | ICD-10-CM | POA: Insufficient documentation

## 2015-03-02 DIAGNOSIS — S335XXA Sprain of ligaments of lumbar spine, initial encounter: Secondary | ICD-10-CM

## 2015-03-02 MED ORDER — TRAMADOL HCL 50 MG PO TABS
50.0000 mg | ORAL_TABLET | Freq: Once | ORAL | Status: AC
  Administered 2015-03-02: 50 mg via ORAL
  Filled 2015-03-02: qty 1

## 2015-03-02 MED ORDER — KETOROLAC TROMETHAMINE 60 MG/2ML IM SOLN
60.0000 mg | Freq: Once | INTRAMUSCULAR | Status: DC
  Filled 2015-03-02: qty 2

## 2015-03-02 MED ORDER — DICLOFENAC POTASSIUM 50 MG PO TABS: 50.0000 mg | ORAL_TABLET | Freq: Three times a day (TID) | ORAL | Status: DC

## 2015-03-02 MED ORDER — CYCLOBENZAPRINE HCL 10 MG PO TABS: 10.0000 mg | ORAL_TABLET | Freq: Two times a day (BID) | ORAL | Status: DC | PRN

## 2015-03-02 MED ORDER — IBUPROFEN 800 MG PO TABS: 800.0000 mg | ORAL_TABLET | Freq: Three times a day (TID) | ORAL | Status: DC

## 2015-03-02 MED ORDER — CYCLOBENZAPRINE HCL 10 MG PO TABS
10.0000 mg | ORAL_TABLET | Freq: Once | ORAL | Status: AC
  Administered 2015-03-02: 10 mg via ORAL
  Filled 2015-03-02: qty 1

## 2015-03-02 MED ORDER — IBUPROFEN 800 MG PO TABS
800.0000 mg | ORAL_TABLET | Freq: Once | ORAL | Status: AC
  Administered 2015-03-02: 800 mg via ORAL
  Filled 2015-03-02: qty 1
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
## 2015-03-02 NOTE — ED Notes (Signed)
Discharge instructions/prescriptions reviewed with patient. Understanding verbalized. Patient declined wheelchair at time of discharge. 
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
## 2015-03-02 NOTE — Discharge Instructions (Signed)
Heat, muscle relaxer and pain medicine as needed, see your doctor if further problems.

## 2015-03-02 NOTE — Discharge Instructions (Signed)
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
## 2015-03-09 NOTE — ED Notes (Signed)
Pt  States  She  Lost  Her  Her  rx  For  diclofanac dr Artis Flockkindl  Notified  rx   Phoned  [facility]  [facility]   [address]

## 2015-03-11 ENCOUNTER — Encounter (HOSPITAL_COMMUNITY): Payer: Self-pay | Admitting: Nurse Practitioner

## 2015-03-11 ENCOUNTER — Emergency Department (HOSPITAL_COMMUNITY): Payer: Self-pay

## 2015-03-11 ENCOUNTER — Emergency Department (HOSPITAL_COMMUNITY)
Admission: EM | Admit: 2015-03-11 | Discharge: 2015-03-11 | Disposition: A | Discharge status: Home / Self Care | Payer: Self-pay | Attending: Emergency Medicine | Admitting: Emergency Medicine

## 2015-03-11 DIAGNOSIS — Y998 Other external cause status: Secondary | ICD-10-CM | POA: Insufficient documentation

## 2015-03-11 DIAGNOSIS — Z79899 Other long term (current) drug therapy: Secondary | ICD-10-CM | POA: Insufficient documentation

## 2015-03-11 DIAGNOSIS — F419 Anxiety disorder, unspecified: Secondary | ICD-10-CM | POA: Insufficient documentation

## 2015-03-11 DIAGNOSIS — Z791 Long term (current) use of non-steroidal anti-inflammatories (NSAID): Secondary | ICD-10-CM | POA: Insufficient documentation

## 2015-03-11 DIAGNOSIS — G4700 Insomnia, unspecified: Secondary | ICD-10-CM | POA: Insufficient documentation

## 2015-03-11 DIAGNOSIS — Z87891 Personal history of nicotine dependence: Secondary | ICD-10-CM | POA: Insufficient documentation

## 2015-03-11 DIAGNOSIS — I10 Essential (primary) hypertension: Secondary | ICD-10-CM | POA: Insufficient documentation

## 2015-03-11 DIAGNOSIS — M62838 Other muscle spasm: Secondary | ICD-10-CM

## 2015-03-11 DIAGNOSIS — Y9389 Activity, other specified: Secondary | ICD-10-CM | POA: Insufficient documentation

## 2015-03-11 DIAGNOSIS — S79911A Unspecified injury of right hip, initial encounter: Secondary | ICD-10-CM | POA: Insufficient documentation

## 2015-03-11 DIAGNOSIS — Y92410 Unspecified street and highway as the place of occurrence of the external cause: Secondary | ICD-10-CM | POA: Insufficient documentation

## 2015-03-11 DIAGNOSIS — J45909 Unspecified asthma, uncomplicated: Secondary | ICD-10-CM | POA: Insufficient documentation

## 2015-03-11 MED ORDER — TRAMADOL HCL 50 MG PO TABS: 50.0000 mg | ORAL_TABLET | Freq: Four times a day (QID) | ORAL | Status: DC | PRN

## 2015-03-11 MED ORDER — CYCLOBENZAPRINE HCL 10 MG PO TABS: 10.0000 mg | ORAL_TABLET | Freq: Two times a day (BID) | ORAL | Status: DC | PRN
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
## 2015-03-11 NOTE — Discharge Instructions (Signed)
Heat Therapy °Heat therapy can help ease sore, stiff, injured, and tight muscles and joints. Heat relaxes your muscles, which may help ease your pain.  °RISKS AND COMPLICATIONS °If you have any of the following conditions, do not use heat therapy unless your health care provider has approved: °· Poor circulation. °· Healing wounds or scarred skin in the area being treated. °· Diabetes, heart disease, or high blood pressure. °· Not being able to feel (numbness) the area being treated. °· Unusual swelling of the area being treated. °· Active infections. °· Blood clots. °· Cancer. °· Inability to communicate pain. This may include young children and people who have problems with their brain function (dementia). °· Pregnancy. °Heat therapy should only be used on old, pre-existing, or long-lasting (chronic) injuries. Do not use heat therapy on new injuries unless directed by your health care provider. °HOW TO USE HEAT THERAPY °There are several different kinds of heat therapy, including: °· Moist heat pack. °· Warm water bath. °· Hot water bottle. °· Electric heating pad. °· Heated gel pack. °· Heated wrap. °· Electric heating pad. °Use the heat therapy method suggested by your health care provider. Follow your health care provider's instructions on when and how to use heat therapy. °GENERAL HEAT THERAPY RECOMMENDATIONS °· Do not sleep while using heat therapy. Only use heat therapy while you are awake. °· Your skin may turn pink while using heat therapy. Do not use heat therapy if your skin turns red. °· Do not use heat therapy if you have new pain. °· High heat or long exposure to heat can cause burns. Be careful when using heat therapy to avoid burning your skin. °· Do not use heat therapy on areas of your skin that are already irritated, such as with a rash or sunburn. °SEEK MEDICAL CARE IF: °· You have blisters, redness, swelling, or numbness. °· You have new pain. °· Your pain is worse. °MAKE SURE  YOU: °· Understand these instructions. °· Will watch your condition. °· Will get help right away if you are not doing well or get worse. °  °This information is not intended to replace advice given to you by your health care provider. Make sure you discuss any questions you have with your health care provider. °  °Document Released: 07/02/2011 Document Revised: 04/30/2014 Document Reviewed: 06/02/2013 °Elsevier Interactive Patient Education ©2016 [employer] Inc[employer] ° °Muscle Cramps and Spasms °Muscle cramps and spasms occur when a muscle or muscles tighten and you have no control over this tightening (involuntary muscle contraction). They are a common problem and can develop in any muscle. The most common place is in the calf muscles of the leg. Both muscle cramps and muscle spasms are involuntary muscle contractions, but they also have differences:  °· Muscle cramps are sporadic and painful. They may last a few seconds to a quarter of an hour. Muscle cramps are often more forceful and last longer than muscle spasms. °· Muscle spasms may or may not be painful. They may also last just a few seconds or much longer. °CAUSES  °It is uncommon for cramps or spasms to be due to a serious underlying problem. In many cases, the cause of cramps or spasms is unknown. Some common causes are:  °· Overexertion.   °· Overuse from repetitive motions (doing the same thing over and over).   °· Remaining in a certain position for a long period of time.   °· Improper preparation, form, or technique while performing a sport or activity.   °·   Dehydration.   Injury.   Side effects of some medicines.   Abnormally low levels of the salts and ions in your blood (electrolytes), especially potassium and calcium. This could happen if you are taking water pills (diuretics) or you are pregnant.  Some underlying medical problems can make it more likely to develop cramps or spasms. These include, but are not limited to:   Diabetes.    Parkinson disease.   Hormone disorders, such as thyroid problems.   Alcohol abuse.   Diseases specific to muscles, joints, and bones.   Blood vessel disease where not enough blood is getting to the muscles.  HOME CARE INSTRUCTIONS   Stay well hydrated. Drink enough water and fluids to keep your urine clear or pale yellow.  It may be helpful to massage, stretch, and relax the affected muscle.  For tight or tense muscles, use a warm towel, heating pad, or hot shower water directed to the affected area.  If you are sore or have pain after a cramp or spasm, applying ice to the affected area may relieve discomfort.  Put ice in a plastic bag.  Place a towel between your skin and the bag.  Leave the ice on for 15-20 minutes, 03-04 times a day.  Medicines used to treat a known cause of cramps or spasms may help reduce their frequency or severity. Only take over-the-counter or prescription medicines as directed by your caregiver. SEEK MEDICAL CARE IF:  Your cramps or spasms get more severe, more frequent, or do not improve over time.  MAKE SURE YOU:   Understand these instructions.  Will watch your condition.  Will get help right away if you are not doing well or get worse.   This information is not intended to replace advice given to you by your health care provider. Make sure you discuss any questions you have with your health care provider.   Document Released: 09/29/2001 Document Revised: 08/04/2012 Document Reviewed: 03/26/2012 Elsevier Interactive Patient Education 2016 [employer]  Tourist information centre managerMotor Vehicle Collision It is common to have multiple bruises and sore muscles after a motor vehicle collision (MVC). These tend to feel worse for the first 24 hours. You may have the most stiffness and soreness over the first several hours. You may also feel worse when you wake up the first morning after your collision. After this point, you will usually begin to improve with each day.  The speed of improvement often depends on the severity of the collision, the number of injuries, and the location and nature of these injuries. HOME CARE INSTRUCTIONS  Put ice on the injured area.  Put ice in a plastic bag.  Place a towel between your skin and the bag.  Leave the ice on for 15-20 minutes, 3-4 times a day, or as directed by your health care provider.  Drink enough fluids to keep your urine clear or pale yellow. Do not drink alcohol.  Take a warm shower or bath once or twice a day. This will increase blood flow to sore muscles.  You may return to activities as directed by your caregiver. Be careful when lifting, as this may aggravate neck or back pain.  Only take over-the-counter or prescription medicines for pain, discomfort, or fever as directed by your caregiver. Do not use aspirin. This may increase bruising and bleeding. SEEK IMMEDIATE MEDICAL CARE IF:  You have numbness, tingling, or weakness in the arms or legs.  You develop severe headaches not relieved with medicine.  You have  severe neck pain, especially tenderness in the middle of the back of your neck.  You have changes in bowel or bladder control.  There is increasing pain in any area of the body.  You have shortness of breath, light-headedness, dizziness, or fainting.  You have chest pain.  You feel sick to your stomach (nauseous), throw up (vomit), or sweat.  You have increasing abdominal discomfort.  There is blood in your urine, stool, or vomit.  You have pain in your shoulder (shoulder strap areas).  You feel your symptoms are getting worse. MAKE SURE YOU:  Understand these instructions.  Will watch your condition.  Will get help right away if you are not doing well or get worse.   This information is not intended to replace advice given to you by your health care provider. Make sure you discuss any questions you have with your health care provider.   Follow-up with her primary  care provider as needed or if symptoms worsen. Apply ice to affected area. Take prescriptions as prescribed for pain and muscle tension relief. Return to the emergency department if you expands worsening of your symptoms, discoloration of your extremity, inability to move right leg or bear weight on it.
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
## 2015-05-05 MED FILL — ?CYCLOBENZAPRINE 10 MG TABL: 10 | 30 days supply | Qty: 30 | Fill #1

## 2015-05-05 MED FILL — ?SERTRALINE HCL 50 MG TABLE: 50 | 30 days supply | Qty: 30 | Fill #2

## 2015-05-10 ENCOUNTER — Ambulatory Visit: Payer: Self-pay | Admitting: Internal Medicine

## 2015-05-16 ENCOUNTER — Ambulatory Visit: Payer: Self-pay | Admitting: Internal Medicine

## 2015-08-11 ENCOUNTER — Ambulatory Visit: Payer: Self-pay | Admitting: Family Medicine

## 2015-11-13 ENCOUNTER — Encounter (HOSPITAL_COMMUNITY): Payer: Self-pay | Admitting: Family Medicine

## 2015-11-13 ENCOUNTER — Ambulatory Visit (HOSPITAL_COMMUNITY)
Admission: EM | Admit: 2015-11-13 | Discharge: 2015-11-13 | Disposition: A | Discharge status: Home / Self Care | Payer: Self-pay | Attending: Family Medicine | Admitting: Family Medicine

## 2015-11-13 DIAGNOSIS — F419 Anxiety disorder, unspecified: Secondary | ICD-10-CM | POA: Insufficient documentation

## 2015-11-13 DIAGNOSIS — N898 Other specified noninflammatory disorders of vagina: Secondary | ICD-10-CM

## 2015-11-13 DIAGNOSIS — R1030 Lower abdominal pain, unspecified: Secondary | ICD-10-CM

## 2015-11-13 DIAGNOSIS — I10 Essential (primary) hypertension: Secondary | ICD-10-CM | POA: Insufficient documentation

## 2015-11-13 DIAGNOSIS — J45909 Unspecified asthma, uncomplicated: Secondary | ICD-10-CM | POA: Insufficient documentation

## 2015-11-13 LAB — POCT URINALYSIS DIP (DEVICE)
BILIRUBIN URINE: NEGATIVE
Glucose, UA: NEGATIVE mg/dL
Hgb urine dipstick: NEGATIVE
KETONES UR: NEGATIVE mg/dL
LEUKOCYTES UA: NEGATIVE
NITRITE: NEGATIVE
PH: 7 (ref 5.0–8.0)
Protein, ur: 30 mg/dL — AB
Specific Gravity, Urine: 1.025 (ref 1.005–1.030)
Urobilinogen, UA: 1 mg/dL (ref 0.0–1.0)

## 2015-11-13 LAB — POCT PREGNANCY, URINE: PREG TEST UR: NEGATIVE

## 2015-11-13 MED ORDER — METRONIDAZOLE 500 MG PO TABS: 2000.0000 mg | ORAL_TABLET | Freq: Once | ORAL | 0 refills | Status: DC

## 2015-11-13 MED ORDER — AZITHROMYCIN 250 MG PO TABS
1000.0000 mg | ORAL_TABLET | Freq: Once | ORAL | Status: AC
  Administered 2015-11-13: 1000 mg via ORAL

## 2015-11-13 MED ORDER — CEFTRIAXONE SODIUM 250 MG IJ SOLR
INTRAMUSCULAR | Status: AC
  Filled 2015-11-13: qty 250

## 2015-11-13 MED ORDER — CEFTRIAXONE SODIUM 250 MG IJ SOLR
250.0000 mg | Freq: Once | INTRAMUSCULAR | Status: AC
  Administered 2015-11-13: 250 mg via INTRAMUSCULAR

## 2015-11-13 MED ORDER — AZITHROMYCIN 250 MG PO TABS
ORAL_TABLET | ORAL | Status: AC
  Filled 2015-11-13: qty 1

## 2015-11-13 MED ORDER — METRONIDAZOLE 500 MG PO TABS: 2000.0000 mg | ORAL_TABLET | Freq: Once | ORAL | 0 refills | Status: AC

## 2015-11-13 NOTE — ED Provider Notes (Signed)
CSN: [id number]     Arrival date & time 11/13/15  1622 History   First MD Initiated Contact with Patient 11/13/15 1649     Chief Complaint  Patient presents with  . Vaginal Discharge  . Abdominal Pain   (Consider location/radiation/quality/duration/timing/severity/associated sxs/prior Treatment) Patient presents today for abdominal pain and would like to be check for STD including HIV. She have been having this abdominal pain x 1 months. Pain is located at the lower abdomen. She describes her pain sometimes as dull and sometimes as sharp. She has no dysuria, no urinary frequency or urgency. She has 1 sexual partner. Denies confirmed STI exposure.  She also noticed to have vaginal discharge x 1 month ago. Her discharge is white and thick accompany by vaginal itchiness sometimes. She have had similar abdominal pain previously and was tested positive for trich at the time.       Past Medical History:  Diagnosis Date  . Anxiety   . Asthma   . Hypertension   . Insomnia   . Muscle spasm   . Speech impediment    Past Surgical History:  Procedure Laterality Date  . TUBAL LIGATION     Family History  Problem Relation Age of Onset  . Diabetes Father   . Hypertension Father   . Heart disease Father   . Stroke Father   . Stuttering Father   . Cancer Sister   . Diabetes Maternal Grandmother   . Cancer Maternal Grandmother   . Hypertension Maternal Grandfather   . Hypertension Paternal Grandfather   . Mental illness Daughter    Social History  Substance Use Topics  . Smoking status: Former Games developer  . Smokeless tobacco: Never Used  . Alcohol use 0.0 oz/week     Comment: Occasional   OB History    No data available     Review of Systems  Constitutional: Negative for chills and fever.  Gastrointestinal: Positive for abdominal pain. Negative for abdominal distention and nausea.  Genitourinary: Positive for pelvic pain and vaginal discharge. Negative for difficulty urinating,  dyspareunia, dysuria, flank pain, frequency and urgency.       Positive for vaginal itchiness    Allergies  Septra [sulfamethoxazole-trimethoprim] and Sulfa antibiotics  Home Medications   Prior to Admission medications   Medication Sig Start Date End Date Taking? Authorizing Provider  albuterol (PROVENTIL HFA;VENTOLIN HFA) 108 (90 BASE) MCG/ACT inhaler Inhale 2 puffs into the lungs every 6 (six) hours as needed. For asthma symptoms 10/12/14   Doris Cheadle, MD  cyclobenzaprine (FLEXERIL) 10 MG tablet Take 1 tablet (10 mg total) by mouth at bedtime. 10/12/14   Doris Cheadle, MD  cyclobenzaprine (FLEXERIL) 10 MG tablet Take 1 tablet (10 mg total) by mouth 2 (two) times daily as needed for muscle spasms. 03/02/15   Arby Barrette, MD  cyclobenzaprine (FLEXERIL) 10 MG tablet Take 1 tablet (10 mg total) by mouth 2 (two) times daily as needed for muscle spasms. 03/11/15   Samantha Tripp Dowless, PA-C  diclofenac (CATAFLAM) 50 MG tablet Take 1 tablet (50 mg total) by mouth 3 (three) times daily. 03/02/15   Linna Hoff, MD  ibuprofen (ADVIL,MOTRIN) 800 MG tablet Take 1 tablet (800 mg total) by mouth 3 (three) times daily. 03/02/15   Arby Barrette, MD  metroNIDAZOLE (FLAGYL) 500 MG tablet Take 4 tablets (2,000 mg total) by mouth once. 11/13/15 11/13/15  Lucia Estelle, NP  Multiple Vitamins-Minerals (WOMENS MULTIVITAMIN PLUS) TABS Take 1 tablet by mouth daily.  Historical Provider, MD  potassium chloride SA (K-DUR,KLOR-CON) 20 MEQ tablet Take 1 tablet (20 mEq total) by mouth daily. 12/08/14   Charm Rings, MD  risperiDONE (RISPERDAL) 0.5 MG tablet Take 0.5 mg by mouth 2 (two) times daily.    Historical Provider, MD  sertraline (ZOLOFT) 50 MG tablet Take 1 tablet (50 mg total) by mouth daily. 05/21/14   Doris Cheadle, MD  traMADol (ULTRAM) 50 MG tablet Take 1 tablet (50 mg total) by mouth every 6 (six) hours as needed. 03/11/15   Samantha Tripp Dowless, PA-C  Vitamin D, Ergocalciferol, (DRISDOL) 50000 UNITS  CAPS capsule Take 1 capsule (50,000 Units total) by mouth every 7 (seven) days. 05/24/14   Doris Cheadle, MD  zolpidem (AMBIEN) 5 MG tablet Take 5 mg by mouth at bedtime as needed for sleep.    Historical Provider, MD   Meds Ordered and Administered this Visit   Medications  cefTRIAXone (ROCEPHIN) injection 250 mg (250 mg Intramuscular Given 11/13/15 1741)  azithromycin (ZITHROMAX) tablet 1,000 mg (1,000 mg Oral Given 11/13/15 1741)    BP 133/62   Pulse 70   Temp 98.2 F (36.8 C)   Resp 18   SpO2 98%  No data found.   Physical Exam  Constitutional: She is oriented to person, place, and time. She appears well-developed and well-nourished.  Cardiovascular: Normal rate, regular rhythm and normal heart sounds.   Pulmonary/Chest: Effort normal and breath sounds normal.  Genitourinary: Uterus normal. Vaginal discharge found.  Genitourinary Comments: Minimal yellow to light green vaginal discharge. No CMT. No lesion on vaginal wall.   Neurological: She is alert and oriented to person, place, and time.  Skin: Skin is warm and dry.    Urgent Care Course   Clinical Course    Procedures (including critical care time)  Labs Review Labs Reviewed  POCT URINALYSIS DIP (DEVICE) - Abnormal; Notable for the following:       Result Value   Protein, ur 30 (*)    All other components within normal limits  WET PREP, GENITAL  HIV ANTIBODY (ROUTINE TESTING)  POCT PREGNANCY, URINE  GC/CHLAMYDIA PROBE AMP ([facility]) NOT AT [facility]    Imaging Review No results found.   Visual Acuity Review  Right Eye Distance:   Left Eye Distance:   Bilateral Distance:    Right Eye Near:   Left Eye Near:    Bilateral Near:         MDM   1. Vaginal discharge   2. Lower abdominal pain    Patient presents today for abdominal pain and would like to check for STDs including HIV. She has been having this abdominal pain for 1 month. Her urine was negative for UTI. She has had this same abdominal pain  before in the past and was  tested positive for Trichomonas then. She admits to being sexually sexually active with 1 partner. Physical exam was overall normal but did found to have a yellowish-green vaginal discharge. She had negative cervical motion tenderness. Pelvic exam was done and specimen send to lab to check for Trich, GC, chlamydia, clue cells, and yeast. Meanwhile, patient agrees to be treated for STDs today including trichomonas, which I suspect as well. Rocephin and Azithromycin given in urgent care. Rx for flagyl given to patient.     Lucia Estelle, NP 11/13/15 1746

## 2015-11-14 LAB — GC/CHLAMYDIA PROBE AMP (~~LOC~~) NOT AT ARMC
Chlamydia: NEGATIVE
NEISSERIA GONORRHEA: NEGATIVE

## 2015-11-14 LAB — HIV ANTIBODY (ROUTINE TESTING W REFLEX): HIV SCREEN 4TH GENERATION: NONREACTIVE

## 2015-11-15 LAB — CERVICOVAGINAL ANCILLARY ONLY: Wet Prep (BD Affirm): POSITIVE — AB

## 2015-11-17 ENCOUNTER — Telehealth (HOSPITAL_COMMUNITY): Payer: Self-pay | Admitting: *Deleted

## 2015-12-14 NOTE — Telephone Encounter (Signed)
Per Dr. Piedad ClimesHonig,  Notes Recorded by Charm RingsErin J Honig, MD on 11/16/2015 at 1:01 PM EDT Negative STD testing. [facility] for BV [facility] Follow up as needed.  Called pt and notified of recent lab results from visit 7/24 Pt ID'd properly... Reports feeling better and sx have subsided  Reports she was able to look results on MyChart and finished course of medication.  Adv pt if sx are not getting better to return Education on safe sex given Pt verb understanding.

## 2016-09-14 ENCOUNTER — Ambulatory Visit (HOSPITAL_COMMUNITY)
Admission: EM | Admit: 2016-09-14 | Discharge: 2016-09-14 | Disposition: A | Discharge status: Home / Self Care | Payer: Self-pay | Attending: Internal Medicine | Admitting: Internal Medicine

## 2016-09-14 ENCOUNTER — Encounter (HOSPITAL_COMMUNITY): Payer: Self-pay | Admitting: *Deleted

## 2016-09-14 DIAGNOSIS — R609 Edema, unspecified: Secondary | ICD-10-CM

## 2016-09-14 MED ORDER — FUROSEMIDE 20 MG PO TABS: 20.0000 mg | ORAL_TABLET | Freq: Every day | ORAL | 1 refills | Status: DC

## 2016-09-14 NOTE — ED Provider Notes (Signed)
CSN: [id number]     Arrival date & time 09/14/16  1507 History   None    Chief Complaint  Patient presents with  . Joint Swelling   (Consider location/radiation/quality/duration/timing/severity/associated sxs/prior Treatment) The history is provided by the patient. No language interpreter was used.  Extremity Pain  This is a new problem. The current episode started 2 days ago. The problem occurs constantly. The problem has been gradually worsening. Nothing aggravates the symptoms. Nothing relieves the symptoms. She has tried nothing for the symptoms. The treatment provided no relief.  Pt reports she has been on her feet a lot.  Pt reports she is swelling in her lower legs.    Past Medical History:  Diagnosis Date  . Anxiety   . Asthma   . Hypertension   . Insomnia   . Muscle spasm   . Speech impediment    Past Surgical History:  Procedure Laterality Date  . TUBAL LIGATION     Family History  Problem Relation Age of Onset  . Diabetes Father   . Hypertension Father   . Heart disease Father   . Stroke Father   . Stuttering Father   . Cancer Sister   . Diabetes Maternal Grandmother   . Cancer Maternal Grandmother   . Hypertension Maternal Grandfather   . Hypertension Paternal Grandfather   . Mental illness Daughter    Social History  Substance Use Topics  . Smoking status: Former Games developermoker  . Smokeless tobacco: Never Used  . Alcohol use 0.0 oz/week     Comment: Occasional   OB History    No data available     Review of Systems  All other systems reviewed and are negative.   Allergies  Septra [sulfamethoxazole-trimethoprim] and Sulfa antibiotics  Home Medications   Prior to Admission medications   Medication Sig Start Date End Date Taking? Authorizing Provider  albuterol (PROVENTIL HFA;VENTOLIN HFA) 108 (90 BASE) MCG/ACT inhaler Inhale 2 puffs into the lungs every 6 (six) hours as needed. For asthma symptoms 10/12/14  Yes Advani, Ayesha Rumpfeepak, MD  cyclobenzaprine  (FLEXERIL) 10 MG tablet Take 1 tablet (10 mg total) by mouth at bedtime. 10/12/14  Yes Advani, Ayesha Rumpfeepak, MD  Multiple Vitamins-Minerals (WOMENS MULTIVITAMIN PLUS) TABS Take 1 tablet by mouth daily.   Yes [provider]  Vitamin D, Ergocalciferol, (DRISDOL) 50000 UNITS CAPS capsule Take 1 capsule (50,000 Units total) by mouth every 7 (seven) days. 05/24/14  Yes Advani, Ayesha Rumpfeepak, MD  cyclobenzaprine (FLEXERIL) 10 MG tablet Take 1 tablet (10 mg total) by mouth 2 (two) times daily as needed for muscle spasms. 03/02/15   Arby BarrettePfeiffer, Marcy, MD  cyclobenzaprine (FLEXERIL) 10 MG tablet Take 1 tablet (10 mg total) by mouth 2 (two) times daily as needed for muscle spasms. 03/11/15   Dowless, Lelon MastSamantha Tripp, PA-C  diclofenac (CATAFLAM) 50 MG tablet Take 1 tablet (50 mg total) by mouth 3 (three) times daily. 03/02/15   Linna HoffKindl, James D, MD  furosemide (LASIX) 20 MG tablet Take 1 tablet (20 mg total) by mouth daily. 09/14/16   Elson AreasSofia, Leslie K, PA-C  ibuprofen (ADVIL,MOTRIN) 800 MG tablet Take 1 tablet (800 mg total) by mouth 3 (three) times daily. 03/02/15   Arby BarrettePfeiffer, Marcy, MD  potassium chloride SA (K-DUR,KLOR-CON) 20 MEQ tablet Take 1 tablet (20 mEq total) by mouth daily. 12/08/14   Charm RingsHonig, Erin J, MD  risperiDONE (RISPERDAL) 0.5 MG tablet Take 0.5 mg by mouth 2 (two) times daily.    [provider]  sertraline (ZOLOFT)  50 MG tablet Take 1 tablet (50 mg total) by mouth daily. 05/21/14   Doris Cheadle, MD  traMADol (ULTRAM) 50 MG tablet Take 1 tablet (50 mg total) by mouth every 6 (six) hours as needed. 03/11/15   Dowless, Lelon Mast Tripp, PA-C  zolpidem (AMBIEN) 5 MG tablet Take 5 mg by mouth at bedtime as needed for sleep.    [provider]   Meds Ordered and Administered this Visit  Medications - No data to display  BP 121/87 (BP Location: Left Arm)   Pulse 76   Temp 98 F (36.7 C) (Oral)   Resp 19  No data found.   Physical Exam  Constitutional: She appears well-developed and  well-nourished. No distress.  HENT:  Head: Normocephalic and atraumatic.  Eyes: Conjunctivae are normal.  Neck: Neck supple.  Cardiovascular: Normal rate and regular rhythm.   No murmur heard. Pulmonary/Chest: Effort normal and breath sounds normal. No respiratory distress.  Abdominal: Soft. There is no tenderness.  Musculoskeletal: She exhibits edema.  Edema bilat lower extremities,   Neurological: She is alert.  Skin: Skin is warm and dry.  Psychiatric: She has a normal mood and affect.  Nursing note and vitals reviewed.   Urgent Care Course     Procedures (including critical care time)  Labs Review Labs Reviewed - No data to display  Imaging Review No results found.   Visual Acuity Review  Right Eye Distance:   Left Eye Distance:   Bilateral Distance:    Right Eye Near:   Left Eye Near:    Bilateral Near:         MDM   1. Edema, unspecified type    Elevate. Avoid salt,  Meds ordered this encounter  Medications  . furosemide (LASIX) 20 MG tablet    Sig: Take 1 tablet (20 mg total) by mouth daily.    Dispense:  30 tablet    Refill:  1    Order Specific Question:   Supervising Provider    Answer:   Eustace Moore [[id number]]   An After Visit Summary was printed and given to the patient.    Elson Areas, [place] 09/14/16 1810

## 2016-09-14 NOTE — ED Triage Notes (Signed)
Patient reports 2 day history of swelling to bilateral ankles and into thighs, patient reports numbness and tingling. Patient states she is on her feet a lot.

## 2016-09-14 NOTE — Discharge Instructions (Signed)
Return if any problems.  Schedule to see a primary care MD for recheck

## 2017-02-28 ENCOUNTER — Ambulatory Visit (HOSPITAL_COMMUNITY)
Admission: EM | Admit: 2017-02-28 | Discharge: 2017-02-28 | Disposition: A | Discharge status: Home / Self Care | Payer: Self-pay | Attending: Family Medicine | Admitting: Family Medicine

## 2017-02-28 ENCOUNTER — Encounter (HOSPITAL_COMMUNITY): Payer: Self-pay | Admitting: Emergency Medicine

## 2017-02-28 DIAGNOSIS — J45901 Unspecified asthma with (acute) exacerbation: Secondary | ICD-10-CM

## 2017-02-28 DIAGNOSIS — J069 Acute upper respiratory infection, unspecified: Secondary | ICD-10-CM

## 2017-02-28 MED ORDER — PREDNISONE 10 MG (21) PO TBPK: ORAL_TABLET | Freq: Every day | ORAL | 0 refills | Status: DC

## 2017-02-28 NOTE — ED Triage Notes (Signed)
PT reports body aches, chills, productive cough, congestion, and SOB for 3 days. History of asthma.

## 2017-02-28 NOTE — ED Provider Notes (Signed)
[facility]    CSN: [id number] Arrival date & time: 02/28/17  1814     History   Chief Complaint Chief Complaint  Patient presents with  . URI  . Shortness of Breath    HPI Rebecca Stevenson is a 46 y.o. female.   With history of asthma, hypertension, anxiety, presenting to the urgent care today for asthma flare up. Asthma is normally well controlled on albuterol alone.  Patient reports to have shortness of breath, wheezing, productive cough, fever, chills and body aches. Symptoms has been present for a week with no improvement. Patient have not tried anything over-the-counter to help. Patient is unknown of any sick contact exposure. Patient denies any chest pain, N/V/D.     Shortness of Breath  Associated symptoms: cough, fever, headaches, sore throat and wheezing   Associated symptoms: no abdominal pain, no chest pain, no ear pain, no rash and no vomiting     Past Medical History:  Diagnosis Date  . Anxiety   . Asthma   . Hypertension   . Insomnia   . Muscle spasm   . Speech impediment     Patient Active Problem List   Diagnosis Date Noted  . Anxiety state 05/21/2014  . Muscle spasm 05/21/2014  . Family history of diabetes mellitus (DM) 05/21/2014    Past Surgical History:  Procedure Laterality Date  . TUBAL LIGATION      OB History    No data available       Home Medications    Prior to Admission medications   Medication Sig Start Date End Date Taking? Authorizing Provider  albuterol (PROVENTIL HFA;VENTOLIN HFA) 108 (90 BASE) MCG/ACT inhaler Inhale 2 puffs into the lungs every 6 (six) hours as needed. For asthma symptoms 10/12/14  Yes Advani, Ayesha Rumpfeepak, MD  Multiple Vitamins-Minerals (WOMENS MULTIVITAMIN PLUS) TABS Take 1 tablet by mouth daily.   Yes [provider]  cyclobenzaprine (FLEXERIL) 10 MG tablet Take 1 tablet (10 mg total) by mouth at bedtime. 10/12/14   Doris CheadleAdvani, Deepak, MD  cyclobenzaprine (FLEXERIL) 10 MG tablet Take 1  tablet (10 mg total) by mouth 2 (two) times daily as needed for muscle spasms. 03/02/15   Arby BarrettePfeiffer, Marcy, MD  cyclobenzaprine (FLEXERIL) 10 MG tablet Take 1 tablet (10 mg total) by mouth 2 (two) times daily as needed for muscle spasms. 03/11/15   Dowless, Lelon MastSamantha Tripp, PA-C  diclofenac (CATAFLAM) 50 MG tablet Take 1 tablet (50 mg total) by mouth 3 (three) times daily. 03/02/15   Linna HoffKindl, James D, MD  furosemide (LASIX) 20 MG tablet Take 1 tablet (20 mg total) by mouth daily. 09/14/16   Elson AreasSofia, Leslie K, PA-C  ibuprofen (ADVIL,MOTRIN) 800 MG tablet Take 1 tablet (800 mg total) by mouth 3 (three) times daily. 03/02/15   Arby BarrettePfeiffer, Marcy, MD  potassium chloride SA (K-DUR,KLOR-CON) 20 MEQ tablet Take 1 tablet (20 mEq total) by mouth daily. 12/08/14   Charm RingsHonig, Erin J, MD  predniSONE (STERAPRED UNI-PAK 21 TAB) 10 MG (21) TBPK tablet Take daily by mouth. Take 6 tabs by mouth daily  For 1 days, then 5 tabs for 1 days, then 4 tabs for 1 days, then 3 tabs for 1 days, 2 tabs for 1 days, then 1 tab by mouth daily for 1 days 02/28/17   Lucia EstelleZheng, Rishav Rockefeller, NP  risperiDONE (RISPERDAL) 0.5 MG tablet Take 0.5 mg by mouth 2 (two) times daily.    [provider]  sertraline (ZOLOFT) 50 MG tablet Take 1 tablet (50  mg total) by mouth daily. 05/21/14   Doris Cheadle, MD  traMADol (ULTRAM) 50 MG tablet Take 1 tablet (50 mg total) by mouth every 6 (six) hours as needed. 03/11/15   Dowless, Lelon Mast Tripp, PA-C  Vitamin D, Ergocalciferol, (DRISDOL) 50000 UNITS CAPS capsule Take 1 capsule (50,000 Units total) by mouth every 7 (seven) days. 05/24/14   Doris Cheadle, MD  zolpidem (AMBIEN) 5 MG tablet Take 5 mg by mouth at bedtime as needed for sleep.    [provider]    Family History Family History  Problem Relation Age of Onset  . Diabetes Father   . Hypertension Father   . Heart disease Father   . Stroke Father   . Stuttering Father   . Cancer Sister   . Diabetes Maternal Grandmother   . Cancer Maternal  Grandmother   . Hypertension Maternal Grandfather   . Hypertension Paternal Grandfather   . Mental illness Daughter     Social History Social History   Tobacco Use  . Smoking status: Former Games developer  . Smokeless tobacco: Never Used  Substance Use Topics  . Alcohol use: Yes    Alcohol/week: 0.0 oz    Comment: Occasional  . Drug use: No    Comment: Quit july 24th 2013     Allergies   Septra [sulfamethoxazole-trimethoprim] and Sulfa antibiotics   Review of Systems Review of Systems  Constitutional: Positive for chills, fatigue and fever.  HENT: Positive for rhinorrhea and sore throat. Negative for congestion and ear pain.   Respiratory: Positive for cough, shortness of breath and wheezing.   Cardiovascular: Negative for chest pain and palpitations.  Gastrointestinal: Negative for abdominal pain, nausea and vomiting.  Skin: Negative for rash.  Neurological: Positive for headaches. Negative for dizziness.     Physical Exam Triage Vital Signs ED Triage Vitals  Enc Vitals Group     BP 02/28/17 1837 (!) 152/89     Pulse Rate 02/28/17 1837 82     Resp 02/28/17 1837 18     Temp 02/28/17 1837 99.5 F (37.5 C)     Temp Source 02/28/17 1837 Oral     SpO2 02/28/17 1837 98 %     Weight 02/28/17 1838 264 lb (119.7 kg)     Height 02/28/17 1838 5\' 4"  (1.626 m)     Head Circumference --      Peak Flow --      Pain Score 02/28/17 1838 6     Pain Loc --      Pain Edu? --      Excl. in GC? --    No data found.  Updated Vital Signs BP (!) 152/89   Pulse 82   Temp 99.5 F (37.5 C) (Oral)   Resp 18   Ht 5\' 4"  (1.626 m)   Wt 264 lb (119.7 kg)   LMP 02/13/2017   SpO2 98%   BMI 45.32 kg/m   Visual Acuity Right Eye Distance:   Left Eye Distance:   Bilateral Distance:    Right Eye Near:   Left Eye Near:    Bilateral Near:     Physical Exam  Constitutional: She is oriented to person, place, and time. She appears well-developed and well-nourished.  Non-toxic  appearance. She does not appear ill. No distress.  HENT:  Head: Normocephalic and atraumatic.  Eyes: Pupils are equal, round, and reactive to light.  Neck: Normal range of motion.  Cardiovascular: Normal rate and normal heart sounds.  No murmur heard.  Pulmonary/Chest: Effort normal and breath sounds normal. No respiratory distress. She has no wheezes.  Abdominal: Soft. There is no tenderness.  Musculoskeletal: Normal range of motion.  Lymphadenopathy:    She has no cervical adenopathy.  Neurological: She is alert and oriented to person, place, and time.  Skin: Skin is warm and dry. No rash noted.  Nursing note and vitals reviewed.  UC Treatments / Results  Labs (all labs ordered are listed, but only abnormal results are displayed) Labs Reviewed - No data to display  EKG  EKG Interpretation None      Radiology No results found.  Procedures Procedures (including critical care time)  Medications Ordered in UC Medications - No data to display  Initial Impression / Assessment and Plan / UC Course  I have reviewed the triage vital signs and the nursing notes.  Pertinent labs & imaging results that were available during my care of the patient were reviewed by me and considered in my medical decision making (see chart for details).    Final Clinical Impressions(s) / UC Diagnoses   Final diagnoses:  Upper respiratory tract infection, unspecified type  Mild asthma with acute exacerbation, unspecified whether persistent   Physical examination unremarkable. Lung sounds are clear bilaterally.  Patient appears well clinically. Will send home with 6-day prednisone taper RX. May continue with albuterol inhaler PRN. Also treat symptoms supportively with OTC medication. Return for no improvement.    ED Discharge Orders        Ordered    predniSONE (STERAPRED UNI-PAK 21 TAB) 10 MG (21) TBPK tablet  Daily     02/28/17 1941      Controlled Substance Prescriptions [place] Controlled  Substance Registry consulted? Not Applicable   Lucia EstelleZheng, Espiridion Supinski, NP 02/28/17 1945

## 2017-11-28 ENCOUNTER — Ambulatory Visit: Payer: Self-pay | Admitting: Family Medicine

## 2017-11-30 ENCOUNTER — Emergency Department (HOSPITAL_COMMUNITY)
Admission: EM | Admit: 2017-11-30 | Discharge: 2017-12-01 | Disposition: A | Discharge status: Home / Self Care | Payer: Self-pay | Attending: Emergency Medicine | Admitting: Emergency Medicine

## 2017-11-30 ENCOUNTER — Encounter (HOSPITAL_COMMUNITY): Payer: Self-pay | Admitting: Emergency Medicine

## 2017-11-30 DIAGNOSIS — I10 Essential (primary) hypertension: Secondary | ICD-10-CM | POA: Insufficient documentation

## 2017-11-30 DIAGNOSIS — F419 Anxiety disorder, unspecified: Secondary | ICD-10-CM | POA: Insufficient documentation

## 2017-11-30 DIAGNOSIS — Z87891 Personal history of nicotine dependence: Secondary | ICD-10-CM | POA: Insufficient documentation

## 2017-11-30 LAB — CBC
HCT: 36.6 % (ref 36.0–46.0)
HEMOGLOBIN: 11.5 g/dL — AB (ref 12.0–15.0)
MCH: 28 pg (ref 26.0–34.0)
MCHC: 31.4 g/dL (ref 30.0–36.0)
MCV: 89.3 fL (ref 78.0–100.0)
Platelets: 276 10*3/uL (ref 150–400)
RBC: 4.1 MIL/uL (ref 3.87–5.11)
RDW: 13.2 % (ref 11.5–15.5)
WBC: 7.4 10*3/uL (ref 4.0–10.5)

## 2017-11-30 LAB — BASIC METABOLIC PANEL
Anion gap: 9 (ref 5–15)
BUN: 11 mg/dL (ref 6–20)
CHLORIDE: 100 mmol/L (ref 98–111)
CO2: 27 mmol/L (ref 22–32)
Calcium: 8.4 mg/dL — ABNORMAL LOW (ref 8.9–10.3)
Creatinine, Ser: 0.89 mg/dL (ref 0.44–1.00)
GFR calc Af Amer: >60 mL/min (ref >60)
GFR calc non Af Amer: >60 mL/min (ref >60)
GLUCOSE: 155 mg/dL — AB (ref 70–99)
POTASSIUM: 4.5 mmol/L (ref 3.5–5.1)
Sodium: 136 mmol/L (ref 135–145)

## 2017-11-30 NOTE — ED Triage Notes (Signed)
Brought by ems from home for c/o Headache with dizziness and nausea for three days.  Reports they all come and go.  Reports mild dizziness in triage.  Also reports some anxiety.

## 2017-12-01 ENCOUNTER — Encounter (HOSPITAL_COMMUNITY): Payer: Self-pay | Admitting: Emergency Medicine

## 2017-12-01 ENCOUNTER — Emergency Department (HOSPITAL_COMMUNITY): Payer: Self-pay

## 2017-12-01 LAB — I-STAT BETA HCG BLOOD, ED (MC, WL, AP ONLY)

## 2017-12-01 MED ORDER — ACETAMINOPHEN 500 MG PO TABS
1000.0000 mg | ORAL_TABLET | Freq: Once | ORAL | Status: AC
  Administered 2017-12-01: 1000 mg via ORAL
  Filled 2017-12-01: qty 2

## 2017-12-01 MED ORDER — MECLIZINE HCL 25 MG PO TABS
12.5000 mg | ORAL_TABLET | Freq: Once | ORAL | Status: AC
  Administered 2017-12-01: 12.5 mg via ORAL
  Filled 2017-12-01: qty 1

## 2017-12-01 MED ORDER — IBUPROFEN 800 MG PO TABS
800.0000 mg | ORAL_TABLET | Freq: Once | ORAL | Status: AC
  Administered 2017-12-01: 800 mg via ORAL
  Filled 2017-12-01: qty 1

## 2017-12-01 MED ORDER — MECLIZINE HCL 12.5 MG PO TABS: 12.5000 mg | ORAL_TABLET | Freq: Three times a day (TID) | ORAL | 0 refills | Status: DC | PRN

## 2017-12-01 NOTE — ED Provider Notes (Signed)
[facility] EMERGENCY DEPARTMENT Provider Note   CSN: [id number] Arrival date & time: 11/30/17  2226     History   Chief Complaint Chief Complaint  Patient presents with  . Headache  . Hypertension    HPI Rebecca Stevenson is a 47 y.o. female.  The history is provided by the patient.  Headache   This is a new problem. The current episode started more than 2 days ago. The problem occurs constantly. The problem has not changed since onset.The headache is associated with emotional stress. The pain is located in the frontal region. The quality of the pain is described as dull. The pain is moderate. The pain does not radiate. Pertinent negatives include no anorexia, no fever, no malaise/fatigue, no chest pressure, no near-syncope, no orthopnea, no palpitations, no syncope, no shortness of breath, no nausea and no vomiting. She has tried nothing for the symptoms. The treatment provided no relief.  Called EMS as a home cuff was 160 and she is under emotional stress.  No f/c/r.  No changes in vision or speech.  No focal deficits.  She told triage she was dizzy but denied it to me.    Past Medical History:  Diagnosis Date  . Anxiety   . Asthma   . Hypertension   . Insomnia   . Muscle spasm   . Speech impediment     Patient Active Problem List   Diagnosis Date Noted  . Anxiety state 05/21/2014  . Muscle spasm 05/21/2014  . Family history of diabetes mellitus (DM) 05/21/2014    Past Surgical History:  Procedure Laterality Date  . TUBAL LIGATION       OB History   None      Home Medications    Prior to Admission medications   Medication Sig Start Date End Date Taking? Authorizing Provider  albuterol (PROVENTIL HFA;VENTOLIN HFA) 108 (90 BASE) MCG/ACT inhaler Inhale 2 puffs into the lungs every 6 (six) hours as needed. For asthma symptoms Patient not taking: Reported on 12/01/2017 10/12/14   Doris Cheadle, MD  cyclobenzaprine (FLEXERIL) 10 MG tablet  Take 1 tablet (10 mg total) by mouth at bedtime. Patient not taking: Reported on 12/01/2017 10/12/14   Doris Cheadle, MD  cyclobenzaprine (FLEXERIL) 10 MG tablet Take 1 tablet (10 mg total) by mouth 2 (two) times daily as needed for muscle spasms. Patient not taking: Reported on 12/01/2017 03/02/15   Arby Barrette, MD  cyclobenzaprine (FLEXERIL) 10 MG tablet Take 1 tablet (10 mg total) by mouth 2 (two) times daily as needed for muscle spasms. Patient not taking: Reported on 12/01/2017 03/11/15   Dowless, Lelon Mast Tripp, PA-C  diclofenac (CATAFLAM) 50 MG tablet Take 1 tablet (50 mg total) by mouth 3 (three) times daily. Patient not taking: Reported on 12/01/2017 03/02/15   Linna Hoff, MD  furosemide (LASIX) 20 MG tablet Take 1 tablet (20 mg total) by mouth daily. Patient not taking: Reported on 12/01/2017 09/14/16   Elson Areas, PA-C  ibuprofen (ADVIL,MOTRIN) 800 MG tablet Take 1 tablet (800 mg total) by mouth 3 (three) times daily. Patient not taking: Reported on 12/01/2017 03/02/15   Arby Barrette, MD  meclizine (ANTIVERT) 12.5 MG tablet Take 1 tablet (12.5 mg total) by mouth 3 (three) times daily as needed for dizziness. 12/01/17   Tyquavious Gamel, MD  potassium chloride SA (K-DUR,KLOR-CON) 20 MEQ tablet Take 1 tablet (20 mEq total) by mouth daily. Patient not taking: Reported on 12/01/2017 12/08/14   Piedad Climes,  Finis Bud, MD  predniSONE (STERAPRED UNI-PAK 21 TAB) 10 MG (21) TBPK tablet Take daily by mouth. Take 6 tabs by mouth daily  For 1 days, then 5 tabs for 1 days, then 4 tabs for 1 days, then 3 tabs for 1 days, 2 tabs for 1 days, then 1 tab by mouth daily for 1 days Patient not taking: Reported on 12/01/2017 02/28/17   Lucia Estelle, NP  sertraline (ZOLOFT) 50 MG tablet Take 1 tablet (50 mg total) by mouth daily. Patient not taking: Reported on 12/01/2017 05/21/14   Doris Cheadle, MD  traMADol (ULTRAM) 50 MG tablet Take 1 tablet (50 mg total) by mouth every 6 (six) hours as needed. Patient not taking:  Reported on 12/01/2017 03/11/15   Dowless, Lelon Mast Tripp, PA-C  Vitamin D, Ergocalciferol, (DRISDOL) 50000 UNITS CAPS capsule Take 1 capsule (50,000 Units total) by mouth every 7 (seven) days. Patient not taking: Reported on 12/01/2017 05/24/14   Doris Cheadle, MD    Family History Family History  Problem Relation Age of Onset  . Diabetes Father   . Hypertension Father   . Heart disease Father   . Stroke Father   . Stuttering Father   . Cancer Sister   . Diabetes Maternal Grandmother   . Cancer Maternal Grandmother   . Hypertension Maternal Grandfather   . Hypertension Paternal Grandfather   . Mental illness Daughter     Social History Social History   Tobacco Use  . Smoking status: Former Games developer  . Smokeless tobacco: Never Used  Substance Use Topics  . Alcohol use: Yes    Alcohol/week: 0.0 standard drinks    Comment: Occasional  . Drug use: No    Types: Marijuana    Comment: Quit july 24th 2013     Allergies   Septra [sulfamethoxazole-trimethoprim] and Sulfa antibiotics   Review of Systems Review of Systems  Constitutional: Negative for fever and malaise/fatigue.  Eyes: Negative for photophobia and visual disturbance.  Respiratory: Negative for shortness of breath.   Cardiovascular: Negative for chest pain, palpitations, orthopnea, leg swelling, syncope and near-syncope.  Gastrointestinal: Negative for anorexia, nausea and vomiting.  Musculoskeletal: Negative for neck pain and neck stiffness.  Skin: Negative for rash.  Neurological: Negative for dizziness, tremors, seizures, syncope, facial asymmetry, speech difficulty, weakness, light-headedness, numbness and headaches.  Hematological: Negative for adenopathy.  All other systems reviewed and are negative.    Physical Exam Updated Vital Signs BP 120/75   Pulse 73   Temp 98.4 F (36.9 C) (Oral)   Resp 16   Ht 5' 2.5" (1.588 m)   Wt 112.2 kg   SpO2 100%   BMI 44.52 kg/m   Physical Exam    Constitutional: She is oriented to person, place, and time. She appears well-developed and well-nourished. No distress.  HENT:  Head: Normocephalic and atraumatic.  Mouth/Throat: No oropharyngeal exudate.  No proptosis no changes in cogition  Eyes: Pupils are equal, round, and reactive to light. Conjunctivae are normal.  Neck: Normal range of motion. Neck supple.  Cardiovascular: Normal rate, regular rhythm, normal heart sounds and intact distal pulses.  Pulmonary/Chest: Effort normal and breath sounds normal. No stridor. She has no wheezes. She has no rales.  Abdominal: Soft. Bowel sounds are normal. She exhibits no mass. There is no tenderness. There is no rebound and no guarding.  Musculoskeletal: Normal range of motion.  Neurological: She is alert and oriented to person, place, and time. She displays normal reflexes. No cranial nerve deficit. Coordination  normal.  Skin: Skin is warm and dry. Capillary refill takes less than 2 seconds.  Psychiatric: Her mood appears anxious.     ED Treatments / Results  Labs (all labs ordered are listed, but only abnormal results are displayed) Results for orders placed or performed during the hospital encounter of 11/30/17  CBC  Result Value Ref Range   WBC 7.4 4.0 - 10.5 K/uL   RBC 4.10 3.87 - 5.11 MIL/uL   Hemoglobin 11.5 (L) 12.0 - 15.0 g/dL   HCT 16.136.6 09.636.0 - 04.546.0 %   MCV 89.3 78.0 - 100.0 fL   MCH 28.0 26.0 - 34.0 pg   MCHC 31.4 30.0 - 36.0 g/dL   RDW 40.913.2 81.111.5 - 91.415.5 %   Platelets 276 150 - 400 K/uL  Basic metabolic panel  Result Value Ref Range   Sodium 136 135 - 145 mmol/L   Potassium 4.5 3.5 - 5.1 mmol/L   Chloride 100 98 - 111 mmol/L   CO2 27 22 - 32 mmol/L   Glucose, Bld 155 (H) 70 - 99 mg/dL   BUN 11 6 - 20 mg/dL   Creatinine, Ser 7.820.89 0.44 - 1.00 mg/dL   Calcium 8.4 (L) 8.9 - 10.3 mg/dL   GFR calc non Af Amer >60 >60 mL/min   GFR calc Af Amer >60 >60 mL/min   Anion gap 9 5 - 15  I-Stat Beta hCG blood, ED ([facility], [facility], [facility]  only)  Result Value Ref Range   I-stat hCG, quantitative <5.0 <5 mIU/mL   Comment 3           Ct Head Wo Contrast  Result Date: 12/01/2017 CLINICAL DATA:  Headache with dizziness and nausea for 3 days. EXAM: CT HEAD WITHOUT CONTRAST TECHNIQUE: Contiguous axial images were obtained from the base of the skull through the vertex without intravenous contrast. COMPARISON:  05/08/2014 FINDINGS: BRAIN: The ventricles and sulci are normal. No intraparenchymal hemorrhage, mass effect nor midline shift. No acute large vascular territory infarcts. Grey-white matter distinction is maintained. The basal ganglia are unremarkable. No abnormal extra-axial fluid collections. Basal cisterns are not effaced and midline. The brainstem and cerebellar hemispheres are without acute abnormalities. VASCULAR: Unremarkable. SKULL/SOFT TISSUES: No skull fracture. No significant soft tissue swelling. ORBITS/SINUSES: The included ocular globes and orbital contents are normal.The mastoid air cells are clear. The included paranasal sinuses are well-aerated. OTHER: None. IMPRESSION: Normal head CT Electronically Signed   By: Tollie Ethavid  Kwon M.D.   On: 12/01/2017 01:42    EKG None  Radiology Ct Head Wo Contrast  Result Date: 12/01/2017 CLINICAL DATA:  Headache with dizziness and nausea for 3 days. EXAM: CT HEAD WITHOUT CONTRAST TECHNIQUE: Contiguous axial images were obtained from the base of the skull through the vertex without intravenous contrast. COMPARISON:  05/08/2014 FINDINGS: BRAIN: The ventricles and sulci are normal. No intraparenchymal hemorrhage, mass effect nor midline shift. No acute large vascular territory infarcts. Grey-white matter distinction is maintained. The basal ganglia are unremarkable. No abnormal extra-axial fluid collections. Basal cisterns are not effaced and midline. The brainstem and cerebellar hemispheres are without acute abnormalities. VASCULAR: Unremarkable. SKULL/SOFT TISSUES: No skull fracture. No  significant soft tissue swelling. ORBITS/SINUSES: The included ocular globes and orbital contents are normal.The mastoid air cells are clear. The included paranasal sinuses are well-aerated. OTHER: None. IMPRESSION: Normal head CT Electronically Signed   By: Tollie Ethavid  Kwon M.D.   On: 12/01/2017 01:42    Procedures Procedures (including critical care time)  Medications Ordered in ED  Medications  acetaminophen (TYLENOL) tablet 1,000 mg (1,000 mg Oral Given 12/01/17 0126)  ibuprofen (ADVIL,MOTRIN) tablet 800 mg (800 mg Oral Given 12/01/17 0126)  meclizine (ANTIVERT) tablet 12.5 mg (12.5 mg Oral Given 12/01/17 0205)       Final Clinical Impressions(s) / ED Diagnoses   Final diagnoses:  Anxiety    I suspect all her symptoms are anxiety.  Normal scan.  Normal neuro exam.  Normal BPs.    Return for pain, numbness, changes in vision or speech, fevers >100.4 unrelieved by medication, shortness of breath, intractable vomiting, or diarrhea, abdominal pain, Inability to tolerate liquids or food, cough, altered mental status or any concerns. No signs of systemic illness or infection. The patient is nontoxic-appearing on exam and vital signs are within normal limits. Will refer to urology for microscopy hematuria as patient is asymptomatic.  I have reviewed the triage vital signs and the nursing notes. Pertinent labs &imaging results that were available during my care of the patient were reviewed by me and considered in my medical decision making (see chart for details).  After history, exam, and medical workup I feel the patient has been appropriately medically screened and is safe for discharge home. Pertinent diagnoses were discussed with the patient. Patient was given return precautions.   ED Discharge Orders         Ordered    meclizine (ANTIVERT) 12.5 MG tablet  3 times daily PRN     12/01/17 0201           Darrion Wyszynski, MD 12/01/17 0209

## 2017-12-02 ENCOUNTER — Encounter: Payer: Self-pay | Admitting: Family Medicine

## 2017-12-02 ENCOUNTER — Ambulatory Visit: Payer: Self-pay | Admitting: Family Medicine

## 2018-02-04 ENCOUNTER — Encounter (HOSPITAL_COMMUNITY): Payer: Self-pay

## 2018-02-04 ENCOUNTER — Other Ambulatory Visit: Payer: Self-pay

## 2018-02-04 ENCOUNTER — Emergency Department (HOSPITAL_COMMUNITY)
Admission: EM | Admit: 2018-02-04 | Discharge: 2018-02-04 | Disposition: A | Discharge status: Home / Self Care | Payer: Self-pay | Attending: Emergency Medicine | Admitting: Emergency Medicine

## 2018-02-04 DIAGNOSIS — A084 Viral intestinal infection, unspecified: Secondary | ICD-10-CM | POA: Insufficient documentation

## 2018-02-04 LAB — URINALYSIS, ROUTINE W REFLEX MICROSCOPIC
Bilirubin Urine: NEGATIVE
GLUCOSE, UA: NEGATIVE mg/dL
Hgb urine dipstick: NEGATIVE
Ketones, ur: NEGATIVE mg/dL
LEUKOCYTES UA: NEGATIVE
Nitrite: NEGATIVE
PH: 6 (ref 5.0–8.0)
Protein, ur: NEGATIVE mg/dL
SPECIFIC GRAVITY, URINE: 1.016 (ref 1.005–1.030)

## 2018-02-04 LAB — COMPREHENSIVE METABOLIC PANEL
ALBUMIN: 3.2 g/dL — AB (ref 3.5–5.0)
ALK PHOS: 48 U/L (ref 38–126)
ALT: 20 U/L (ref 0–44)
AST: 20 U/L (ref 15–41)
Anion gap: 5 (ref 5–15)
BUN: 10 mg/dL (ref 6–20)
CALCIUM: 8.6 mg/dL — AB (ref 8.9–10.3)
CHLORIDE: 104 mmol/L (ref 98–111)
CO2: 28 mmol/L (ref 22–32)
Creatinine, Ser: 0.72 mg/dL (ref 0.44–1.00)
GFR calc Af Amer: >60 mL/min (ref >60)
GFR calc non Af Amer: >60 mL/min (ref >60)
GLUCOSE: 114 mg/dL — AB (ref 70–99)
POTASSIUM: 3.6 mmol/L (ref 3.5–5.1)
SODIUM: 137 mmol/L (ref 135–145)
Total Bilirubin: 0.6 mg/dL (ref 0.3–1.2)
Total Protein: 6.4 g/dL — ABNORMAL LOW (ref 6.5–8.1)

## 2018-02-04 LAB — CBC
HEMATOCRIT: 40.3 % (ref 36.0–46.0)
HEMOGLOBIN: 12.1 g/dL (ref 12.0–15.0)
MCH: 27.3 pg (ref 26.0–34.0)
MCHC: 30 g/dL (ref 30.0–36.0)
MCV: 90.8 fL (ref 80.0–100.0)
Platelets: 278 10*3/uL (ref 150–400)
RBC: 4.44 MIL/uL (ref 3.87–5.11)
RDW: 13 % (ref 11.5–15.5)
WBC: 6.8 10*3/uL (ref 4.0–10.5)
nRBC: 0 % (ref 0.0–0.2)

## 2018-02-04 LAB — LIPASE, BLOOD: LIPASE: 45 U/L (ref 11–51)

## 2018-02-04 MED ORDER — LOPERAMIDE HCL 2 MG PO CAPS: 2.0000 mg | ORAL_CAPSULE | Freq: Four times a day (QID) | ORAL | 0 refills | Status: DC | PRN

## 2018-02-04 MED ORDER — ALUM & MAG HYDROXIDE-SIMETH 200-200-20 MG/5ML PO SUSP: 30.0000 mL | Freq: Once | ORAL | Status: DC

## 2018-02-04 MED ORDER — GI COCKTAIL ~~LOC~~
30.0000 mL | Freq: Once | ORAL | Status: AC
  Administered 2018-02-04: 30 mL via ORAL
  Filled 2018-02-04: qty 30

## 2018-02-04 NOTE — ED Triage Notes (Signed)
Pt states that she has been having diarrhea since 10 am this morning after taking her iron and vitamin C. Pt reports that she has had both of these vitamins in the past with no side effects.

## 2018-02-04 NOTE — ED Notes (Signed)
Pt refused lab redraw

## 2018-02-04 NOTE — Discharge Instructions (Addendum)
Please read attached information. If you experience any new or worsening signs or symptoms please return to the emergency room for evaluation. Please follow-up with your primary care provider or specialist as discussed. Please use medication prescribed only as directed and discontinue taking if you have any concerning signs or symptoms.   °

## 2018-02-04 NOTE — ED Provider Notes (Signed)
Patient placed in Quick Look pathway, seen and evaluated   Chief Complaint: abd pain, diarrhea  HPI:  Abdominal pain started 10 am this morning. Reports "bad" diarrhea and nausea. No vomiting. No blood in stool. States over 10bms today. States "just water coming out." no sick contacts. No recent antibiotics. No recent travel. No prior abd surgeries. Tried taking vitamin c and iron tab which did not help. Denies recent alcohol. States takes a lot of ibuprofen.   ROS: positive for abd pain and diarrhea (one)  Physical Exam:   Gen: No distress  Neuro: Awake and Alert  Skin: Warm    Focused Exam: epigastric tenderness. No guarding.   Diffuse abdominal pain, worse in epigastric area.  Diarrhea.  No recent travel, no recent antibiotics.  Pain is mainly in epigastric area when examined.  Will try GI cocktail and labs.  Vitals:   02/04/18 1339 02/04/18 1340  BP: 134/84   Pulse: 67   Resp: 16   Temp: 98.5 F (36.9 C)   TempSrc: Oral   SpO2: 100%   Weight:  108.9 kg  Height:  5\' 2"  (1.575 m)      Initiation of care has begun. The patient has been counseled on the process, plan, and necessity for staying for the completion/evaluation, and the remainder of the medical screening examination    Jaynie Crumble, PA-C 02/04/18 1348    Benjiman Core, MD 02/04/18 [phone]

## 2018-02-04 NOTE — ED Provider Notes (Signed)
[facility] [address] EMERGENCY DEPARTMENT Provider Note   CSN: [id number] Arrival date & time: 02/04/18  1317    History   Chief Complaint Chief Complaint  Patient presents with  . Diarrhea    HPI Rebecca Stevenson is a 47 y.o. female.  HPI   47 year old female presents today with complaints of diarrhea and abdominal pain.  Patient notes some abdominal cramping yesterday, diffuse in nature.  She notes developing diarrhea this morning noting approximately 10 episodes of nonbloody watery diarrhea.  She denies any fever nausea or vomiting.  She notes that she did eat something out of her mother's friend yesterday but is uncertain how long it was in there or the contents.  She also reports taking 3 iron pills and 3 vitamin C pills as she had missed them over the last several days.   Past Medical History:  Diagnosis Date  . Anxiety   . Asthma   . Hypertension   . Insomnia   . Muscle spasm   . Prediabetes 2016   Hgb A1c of 6.0 in 2016  . Speech impediment     Patient Active Problem List   Diagnosis Date Noted  . Anxiety state 05/21/2014  . Muscle spasm 05/21/2014  . Family history of diabetes mellitus (DM) 05/21/2014    Past Surgical History:  Procedure Laterality Date  . TUBAL LIGATION       OB History   None      Home Medications    Prior to Admission medications   Medication Sig Start Date End Date Taking? Authorizing Provider  albuterol (PROVENTIL HFA;VENTOLIN HFA) 108 (90 BASE) MCG/ACT inhaler Inhale 2 puffs into the lungs every 6 (six) hours as needed. For asthma symptoms Patient not taking: Reported on 12/01/2017 10/12/14   Doris Cheadle, MD  cyclobenzaprine (FLEXERIL) 10 MG tablet Take 1 tablet (10 mg total) by mouth at bedtime. Patient not taking: Reported on 12/01/2017 10/12/14   Doris Cheadle, MD  cyclobenzaprine (FLEXERIL) 10 MG tablet Take 1 tablet (10 mg total) by mouth 2 (two) times daily as needed for muscle spasms. Patient not  taking: Reported on 12/01/2017 03/02/15   Arby Barrette, MD  cyclobenzaprine (FLEXERIL) 10 MG tablet Take 1 tablet (10 mg total) by mouth 2 (two) times daily as needed for muscle spasms. Patient not taking: Reported on 12/01/2017 03/11/15   Dowless, Lelon Mast Tripp, PA-C  diclofenac (CATAFLAM) 50 MG tablet Take 1 tablet (50 mg total) by mouth 3 (three) times daily. Patient not taking: Reported on 12/01/2017 03/02/15   Linna Hoff, MD  furosemide (LASIX) 20 MG tablet Take 1 tablet (20 mg total) by mouth daily. Patient not taking: Reported on 12/01/2017 09/14/16   Elson Areas, PA-C  ibuprofen (ADVIL,MOTRIN) 800 MG tablet Take 1 tablet (800 mg total) by mouth 3 (three) times daily. Patient not taking: Reported on 12/01/2017 03/02/15   Arby Barrette, MD  loperamide (IMODIUM) 2 MG capsule Take 1 capsule (2 mg total) by mouth 4 (four) times daily as needed for diarrhea or loose stools. 02/04/18   Tyrez Berrios, Tinnie Gens, PA-C  meclizine (ANTIVERT) 12.5 MG tablet Take 1 tablet (12.5 mg total) by mouth 3 (three) times daily as needed for dizziness. 12/01/17   Palumbo, April, MD  potassium chloride SA (K-DUR,KLOR-CON) 20 MEQ tablet Take 1 tablet (20 mEq total) by mouth daily. Patient not taking: Reported on 12/01/2017 12/08/14   Charm Rings, MD  predniSONE (STERAPRED UNI-PAK 21 TAB) 10 MG (21) TBPK tablet Take daily  by mouth. Take 6 tabs by mouth daily  For 1 days, then 5 tabs for 1 days, then 4 tabs for 1 days, then 3 tabs for 1 days, 2 tabs for 1 days, then 1 tab by mouth daily for 1 days Patient not taking: Reported on 12/01/2017 02/28/17   Lucia Estelle, NP  sertraline (ZOLOFT) 50 MG tablet Take 1 tablet (50 mg total) by mouth daily. Patient not taking: Reported on 12/01/2017 05/21/14   Doris Cheadle, MD  traMADol (ULTRAM) 50 MG tablet Take 1 tablet (50 mg total) by mouth every 6 (six) hours as needed. Patient not taking: Reported on 12/01/2017 03/11/15   Dowless, Lelon Mast Tripp, PA-C  Vitamin D, Ergocalciferol,  (DRISDOL) 50000 UNITS CAPS capsule Take 1 capsule (50,000 Units total) by mouth every 7 (seven) days. Patient not taking: Reported on 12/01/2017 05/24/14   Doris Cheadle, MD    Family History Family History  Problem Relation Age of Onset  . Diabetes Father   . Hypertension Father   . Heart disease Father   . Stroke Father   . Stuttering Father   . Cancer Sister   . Diabetes Maternal Grandmother   . Cancer Maternal Grandmother   . Hypertension Maternal Grandfather   . Hypertension Paternal Grandfather   . Mental illness Daughter     Social History Social History   Tobacco Use  . Smoking status: Former Games developer  . Smokeless tobacco: Never Used  Substance Use Topics  . Alcohol use: Yes    Alcohol/week: 0.0 standard drinks    Comment: Occasional  . Drug use: No    Types: Marijuana     Allergies   Septra [sulfamethoxazole-trimethoprim] and Sulfa antibiotics   Review of Systems Review of Systems  All other systems reviewed and are negative.    Physical Exam Updated Vital Signs BP 134/84 (BP Location: Left Arm)   Pulse 67   Temp 98.5 F (36.9 C) (Oral)   Resp 16   Ht 5\' 2"  (1.575 m)   Wt 108.9 kg   LMP 01/21/2018   SpO2 100%   BMI 43.90 kg/m   Physical Exam  Constitutional: She is oriented to person, place, and time. She appears well-developed and well-nourished.  HENT:  Head: Normocephalic and atraumatic.  Eyes: Pupils are equal, round, and reactive to light. Conjunctivae are normal. Right eye exhibits no discharge. Left eye exhibits no discharge. No scleral icterus.  Neck: Normal range of motion. No JVD present. No tracheal deviation present.  Pulmonary/Chest: Effort normal. No stridor.  Abdominal: She exhibits no distension. There is no tenderness. There is no rebound and no guarding. No hernia.  Neurological: She is alert and oriented to person, place, and time. Coordination normal.  Psychiatric: She has a normal mood and affect. Her behavior is normal.  Judgment and thought content normal.  Nursing note and vitals reviewed.    ED Treatments / Results  Labs (all labs ordered are listed, but only abnormal results are displayed) Labs Reviewed  COMPREHENSIVE METABOLIC PANEL - Abnormal; Notable for the following components:      Result Value   Glucose, Bld 114 (*)    Calcium 8.6 (*)    Total Protein 6.4 (*)    Albumin 3.2 (*)    All other components within normal limits  LIPASE, BLOOD  CBC  URINALYSIS, ROUTINE W REFLEX MICROSCOPIC  I-STAT BETA HCG BLOOD, ED ([facility], [facility], [facility] ONLY)  POC URINE PREG, ED    EKG None  Radiology No results found.  Procedures Procedures (including critical care time)  Medications Ordered in ED Medications  gi cocktail (Maalox,Lidocaine,Donnatal) (30 mLs Oral Given 02/04/18 1401)     Initial Impression / Assessment and Plan / ED Course  I have reviewed the triage vital signs and the nursing notes.  Pertinent labs & imaging results that were available during my care of the patient were reviewed by me and considered in my medical decision making (see chart for details).     Labs: Lipase, CMP, CBC, UA  Imaging:  Consults:  Therapeutics:  Discharge Meds: Imodium  Assessment/Plan: 47 year old female presents today with likely viral gastroneuritis.  Soft nontender abdomen, reassuring laboratory analysis no signs of acute bacterial etiology.  Symptomatic care instructions given, strict return precautions given.  Patient verbalized understanding and agreement to today's plan.      Final Clinical Impressions(s) / ED Diagnoses   Final diagnoses:  Viral gastroenteritis    ED Discharge Orders         Ordered    loperamide (IMODIUM) 2 MG capsule  4 times daily PRN     02/04/18 1603           Eyvonne Mechanic, PA-C 02/04/18 1606    Curatolo, Adam, DO 02/04/18 2131

## 2018-02-04 NOTE — ED Notes (Signed)
ED Provider at bedside. 

## 2018-02-23 ENCOUNTER — Encounter (HOSPITAL_COMMUNITY): Payer: Self-pay | Admitting: Emergency Medicine

## 2018-02-23 ENCOUNTER — Emergency Department (HOSPITAL_COMMUNITY)
Admission: EM | Admit: 2018-02-23 | Discharge: 2018-02-23 | Disposition: A | Discharge status: Home / Self Care | Payer: Self-pay | Attending: Emergency Medicine | Admitting: Emergency Medicine

## 2018-02-23 ENCOUNTER — Emergency Department (HOSPITAL_COMMUNITY): Payer: Self-pay

## 2018-02-23 DIAGNOSIS — I10 Essential (primary) hypertension: Secondary | ICD-10-CM | POA: Insufficient documentation

## 2018-02-23 DIAGNOSIS — G44319 Acute post-traumatic headache, not intractable: Secondary | ICD-10-CM | POA: Insufficient documentation

## 2018-02-23 DIAGNOSIS — J45909 Unspecified asthma, uncomplicated: Secondary | ICD-10-CM | POA: Insufficient documentation

## 2018-02-23 DIAGNOSIS — Z87891 Personal history of nicotine dependence: Secondary | ICD-10-CM | POA: Insufficient documentation

## 2018-02-23 DIAGNOSIS — Y929 Unspecified place or not applicable: Secondary | ICD-10-CM | POA: Insufficient documentation

## 2018-02-23 DIAGNOSIS — M542 Cervicalgia: Secondary | ICD-10-CM | POA: Insufficient documentation

## 2018-02-23 DIAGNOSIS — Y939 Activity, unspecified: Secondary | ICD-10-CM | POA: Insufficient documentation

## 2018-02-23 DIAGNOSIS — S0990XA Unspecified injury of head, initial encounter: Secondary | ICD-10-CM | POA: Insufficient documentation

## 2018-02-23 DIAGNOSIS — E876 Hypokalemia: Secondary | ICD-10-CM | POA: Insufficient documentation

## 2018-02-23 DIAGNOSIS — Y998 Other external cause status: Secondary | ICD-10-CM | POA: Insufficient documentation

## 2018-02-23 LAB — I-STAT CHEM 8, ED
BUN: 4 mg/dL — ABNORMAL LOW (ref 6–20)
CALCIUM ION: 1.12 mmol/L — AB (ref 1.15–1.40)
CHLORIDE: 98 mmol/L (ref 98–111)
Creatinine, Ser: 0.7 mg/dL (ref 0.44–1.00)
Glucose, Bld: 120 mg/dL — ABNORMAL HIGH (ref 70–99)
HCT: 35 % — ABNORMAL LOW (ref 36.0–46.0)
Hemoglobin: 11.9 g/dL — ABNORMAL LOW (ref 12.0–15.0)
POTASSIUM: 3 mmol/L — AB (ref 3.5–5.1)
SODIUM: 141 mmol/L (ref 135–145)
TCO2: 30 mmol/L (ref 22–32)

## 2018-02-23 LAB — I-STAT BETA HCG BLOOD, ED (MC, WL, AP ONLY): I-stat hCG, quantitative: <5 m[IU]/mL (ref <5)

## 2018-02-23 MED ORDER — POTASSIUM CHLORIDE ER 10 MEQ PO TBCR: 40.0000 meq | EXTENDED_RELEASE_TABLET | Freq: Every day | ORAL | 0 refills | Status: DC

## 2018-02-23 MED ORDER — HYDROCHLOROTHIAZIDE 25 MG PO TABS: 25.0000 mg | ORAL_TABLET | Freq: Every day | ORAL | 0 refills | Status: DC

## 2018-02-23 MED ORDER — IOPAMIDOL (ISOVUE-370) INJECTION 76%
INTRAVENOUS | Status: AC
  Administered 2018-02-23: 50 mL
  Filled 2018-02-23: qty 50

## 2018-02-23 MED ORDER — MORPHINE SULFATE (PF) 4 MG/ML IV SOLN
4.0000 mg | Freq: Once | INTRAVENOUS | Status: AC
  Administered 2018-02-23: 4 mg via INTRAVENOUS
  Filled 2018-02-23: qty 1
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
## 2018-06-11 ENCOUNTER — Ambulatory Visit: Payer: Self-pay

## 2018-06-12 ENCOUNTER — Ambulatory Visit: Payer: Self-pay

## 2018-06-20 ENCOUNTER — Other Ambulatory Visit: Payer: Self-pay

## 2018-06-20 ENCOUNTER — Encounter (HOSPITAL_COMMUNITY): Payer: Self-pay | Admitting: *Deleted

## 2018-06-20 ENCOUNTER — Ambulatory Visit (HOSPITAL_COMMUNITY)
Admission: EM | Admit: 2018-06-20 | Discharge: 2018-06-20 | Disposition: A | Discharge status: Home / Self Care | Payer: Self-pay | Attending: Emergency Medicine | Admitting: Emergency Medicine

## 2018-06-20 DIAGNOSIS — L03011 Cellulitis of right finger: Secondary | ICD-10-CM

## 2018-06-20 MED ORDER — NAPROXEN 500 MG PO TABS: 500.0000 mg | ORAL_TABLET | Freq: Two times a day (BID) | ORAL | 0 refills | Status: DC

## 2018-06-20 MED ORDER — DOXYCYCLINE HYCLATE 100 MG PO CAPS: 100.0000 mg | ORAL_CAPSULE | Freq: Two times a day (BID) | ORAL | 0 refills | Status: AC

## 2018-06-20 NOTE — ED Triage Notes (Signed)
States she broke her left thumb nail 2 days ago, denies injury, states her thumb is very sore and swollen.

## 2018-06-20 NOTE — ED Provider Notes (Signed)
[facility]    CSN: [id number] Arrival date & time: 06/20/18  1127     History   Chief Complaint Chief Complaint  Patient presents with  . Hand Pain    HPI Rebecca Stevenson is a 48 y.o. female.   Rebecca Stevenson presents with complaints of right hand thumb pain after her nail broke 3-4 days ago. No drainage. No fevers. Pain is worsening, 8/10. She works in a warehouse, use of the finger causes pain. Has tried advil and bc powder which haven't helped. Endorses MRSA history. No diabetes. Pain is surrounding nail, not necessarily to pad of finger. Painful with any touch or use.    ROS per HPI.      Past Medical History:  Diagnosis Date  . Anxiety   . Asthma   . Hypertension   . Insomnia   . Muscle spasm   . Prediabetes 2016   Hgb A1c of 6.0 in 2016  . Speech impediment     Patient Active Problem List   Diagnosis Date Noted  . Anxiety state 05/21/2014  . Muscle spasm 05/21/2014  . Family history of diabetes mellitus (DM) 05/21/2014    Past Surgical History:  Procedure Laterality Date  . TUBAL LIGATION      OB History   No obstetric history on file.      Home Medications    Prior to Admission medications   Medication Sig Start Date End Date Taking? Authorizing Provider  albuterol (PROVENTIL HFA;VENTOLIN HFA) 108 (90 BASE) MCG/ACT inhaler Inhale 2 puffs into the lungs every 6 (six) hours as needed. For asthma symptoms Patient not taking: Reported on 12/01/2017 10/12/14   Doris Cheadle, MD  cyclobenzaprine (FLEXERIL) 10 MG tablet Take 1 tablet (10 mg total) by mouth at bedtime. Patient not taking: Reported on 12/01/2017 10/12/14   Doris Cheadle, MD  cyclobenzaprine (FLEXERIL) 10 MG tablet Take 1 tablet (10 mg total) by mouth 2 (two) times daily as needed for muscle spasms. Patient not taking: Reported on 12/01/2017 03/02/15   Arby Barrette, MD  cyclobenzaprine (FLEXERIL) 10 MG tablet Take 1 tablet (10 mg total) by mouth 2 (two) times daily as needed  for muscle spasms. Patient not taking: Reported on 12/01/2017 03/11/15   Dowless, Lelon Mast Tripp, PA-C  diclofenac (CATAFLAM) 50 MG tablet Take 1 tablet (50 mg total) by mouth 3 (three) times daily. Patient not taking: Reported on 12/01/2017 03/02/15   Linna Hoff, MD  doxycycline (VIBRAMYCIN) 100 MG capsule Take 1 capsule (100 mg total) by mouth 2 (two) times daily for 10 days. 06/20/18 06/30/18  Georgetta Haber, NP  furosemide (LASIX) 20 MG tablet Take 1 tablet (20 mg total) by mouth daily. Patient not taking: Reported on 12/01/2017 09/14/16   Elson Areas, PA-C  hydrochlorothiazide (HYDRODIURIL) 25 MG tablet Take 1 tablet (25 mg total) by mouth daily. 02/23/18 03/25/18  Liberty Handy, PA-C  ibuprofen (ADVIL,MOTRIN) 800 MG tablet Take 1 tablet (800 mg total) by mouth 3 (three) times daily. Patient not taking: Reported on 12/01/2017 03/02/15   Arby Barrette, MD  loperamide (IMODIUM) 2 MG capsule Take 1 capsule (2 mg total) by mouth 4 (four) times daily as needed for diarrhea or loose stools. 02/04/18   Hedges, Tinnie Gens, PA-C  meclizine (ANTIVERT) 12.5 MG tablet Take 1 tablet (12.5 mg total) by mouth 3 (three) times daily as needed for dizziness. 12/01/17   Palumbo, April, MD  naproxen (NAPROSYN) 500 MG tablet Take 1 tablet (500 mg total) by  mouth 2 (two) times daily. 06/20/18   Georgetta Haber, NP  potassium chloride (K-DUR) 10 MEQ tablet Take 4 tablets (40 mEq total) by mouth daily for 10 days. 02/23/18 03/05/18  Liberty Handy, PA-C  potassium chloride SA (K-DUR,KLOR-CON) 20 MEQ tablet Take 1 tablet (20 mEq total) by mouth daily. Patient not taking: Reported on 12/01/2017 12/08/14   Charm Rings, MD  predniSONE (STERAPRED UNI-PAK 21 TAB) 10 MG (21) TBPK tablet Take daily by mouth. Take 6 tabs by mouth daily  For 1 days, then 5 tabs for 1 days, then 4 tabs for 1 days, then 3 tabs for 1 days, 2 tabs for 1 days, then 1 tab by mouth daily for 1 days Patient not taking: Reported on 12/01/2017 02/28/17    Lucia Estelle, NP  sertraline (ZOLOFT) 50 MG tablet Take 1 tablet (50 mg total) by mouth daily. Patient not taking: Reported on 12/01/2017 05/21/14   Doris Cheadle, MD  traMADol (ULTRAM) 50 MG tablet Take 1 tablet (50 mg total) by mouth every 6 (six) hours as needed. Patient not taking: Reported on 12/01/2017 03/11/15   Dowless, Lelon Mast Tripp, PA-C  Vitamin D, Ergocalciferol, (DRISDOL) 50000 UNITS CAPS capsule Take 1 capsule (50,000 Units total) by mouth every 7 (seven) days. Patient not taking: Reported on 12/01/2017 05/24/14   Doris Cheadle, MD    Family History Family History  Problem Relation Age of Onset  . Diabetes Father   . Hypertension Father   . Heart disease Father   . Stroke Father   . Stuttering Father   . Cancer Sister   . Diabetes Maternal Grandmother   . Cancer Maternal Grandmother   . Hypertension Maternal Grandfather   . Hypertension Paternal Grandfather   . Mental illness Daughter     Social History Social History   Tobacco Use  . Smoking status: Former Games developer  . Smokeless tobacco: Never Used  Substance Use Topics  . Alcohol use: Yes    Alcohol/week: 0.0 standard drinks    Comment: Occasional  . Drug use: No    Types: Marijuana     Allergies   Septra [sulfamethoxazole-trimethoprim] and Sulfa antibiotics   Review of Systems Review of Systems   Physical Exam Triage Vital Signs ED Triage Vitals  Enc Vitals Group     BP 06/20/18 1231 (!) 154/90     Pulse Rate 06/20/18 1231 75     Resp 06/20/18 1231 18     Temp 06/20/18 1231 98.5 F (36.9 C)     Temp Source 06/20/18 1231 Temporal     SpO2 06/20/18 1231 100 %     Weight --      Height --      Head Circumference --      Peak Flow --      Pain Score 06/20/18 1232 8     Pain Loc --      Pain Edu? --      Excl. in GC? --    No data found.  Updated Vital Signs BP (!) 154/90 (BP Location: Right Arm)   Pulse 75   Temp 98.5 F (36.9 C) (Temporal)   Resp 18   LMP 06/19/2018 (Exact Date)    SpO2 100%    Physical Exam Constitutional:      General: She is not in acute distress.    Appearance: She is well-developed.  Cardiovascular:     Rate and Rhythm: Normal rate and regular rhythm.     Heart sounds:  Normal heart sounds.  Pulmonary:     Effort: Pulmonary effort is normal.     Breath sounds: Normal breath sounds.  Musculoskeletal:     Comments: Right hand thumb with tenderness and redness to soft tissue at distal tip. Pad remains soft and less tender; pip without redness or swelling; cap refill < 2 seconds ; no visible abscess, no drainage  Skin:    General: Skin is warm and dry.  Neurological:     Mental Status: She is alert and oriented to person, place, and time.      UC Treatments / Results  Labs (all labs ordered are listed, but only abnormal results are displayed) Labs Reviewed - No data to display  EKG None  Radiology No results found.  Procedures Procedures (including critical care time)  Medications Ordered in UC Medications - No data to display  Initial Impression / Assessment and Plan / UC Course  I have reviewed the triage vital signs and the nursing notes.  Pertinent labs & imaging results that were available during my care of the patient were reviewed by me and considered in my medical decision making (see chart for details).     No obvious visible area for incision at this time, pad of finger remains soft and with less pain, does not appear consistent with felon at this time. Warm soaks, antibiotics prescribed with strict return precautions. Patient verbalized understanding and agreeable to plan.    Final Clinical Impressions(s) / UC Diagnoses   Final diagnoses:  Paronychia of right thumb     Discharge Instructions     Soak finger 3-4 times a day in warm water.  Complete course of antibiotics.  Naproxen twice a day, take with food, don't take additional ibuprofen (advil).  If worsening of pain, swelling, tightness, redness please  go to the ER as may need further intervention.    ED Prescriptions    Medication Sig Dispense Auth. Provider   doxycycline (VIBRAMYCIN) 100 MG capsule Take 1 capsule (100 mg total) by mouth 2 (two) times daily for 10 days. 20 capsule Linus Mako B, NP   naproxen (NAPROSYN) 500 MG tablet Take 1 tablet (500 mg total) by mouth 2 (two) times daily. 30 tablet Georgetta Haber, NP     Controlled Substance Prescriptions [place] Controlled Substance Registry consulted? Not Applicable   Georgetta Haber, NP 06/20/18 1455

## 2018-06-20 NOTE — Discharge Instructions (Signed)
Soak finger 3-4 times a day in warm water.  Complete course of antibiotics.  Naproxen twice a day, take with food, don't take additional ibuprofen (advil).  If worsening of pain, swelling, tightness, redness please go to the ER as may need further intervention.

## 2018-07-08 ENCOUNTER — Encounter (HOSPITAL_COMMUNITY): Payer: Self-pay | Admitting: Emergency Medicine

## 2018-07-08 ENCOUNTER — Other Ambulatory Visit: Payer: Self-pay

## 2018-07-08 ENCOUNTER — Ambulatory Visit (HOSPITAL_COMMUNITY)
Admission: EM | Admit: 2018-07-08 | Discharge: 2018-07-08 | Disposition: A | Discharge status: Home / Self Care | Payer: Self-pay | Attending: Family Medicine | Admitting: Family Medicine

## 2018-07-08 DIAGNOSIS — J069 Acute upper respiratory infection, unspecified: Secondary | ICD-10-CM

## 2018-07-08 DIAGNOSIS — R197 Diarrhea, unspecified: Secondary | ICD-10-CM

## 2018-07-08 DIAGNOSIS — B9789 Other viral agents as the cause of diseases classified elsewhere: Secondary | ICD-10-CM

## 2018-07-08 DIAGNOSIS — J4521 Mild intermittent asthma with (acute) exacerbation: Secondary | ICD-10-CM

## 2018-07-08 MED ORDER — CETIRIZINE HCL 10 MG PO CAPS: 10.0000 mg | ORAL_CAPSULE | Freq: Every day | ORAL | 0 refills | Status: DC

## 2018-07-08 MED ORDER — PREDNISONE 10 MG PO TABS: 30.0000 mg | ORAL_TABLET | Freq: Every day | ORAL | 0 refills | Status: DC

## 2018-07-08 MED ORDER — IPRATROPIUM-ALBUTEROL 0.5-2.5 (3) MG/3ML IN SOLN
3.0000 mL | Freq: Once | RESPIRATORY_TRACT | Status: AC
  Administered 2018-07-08: 3 mL via RESPIRATORY_TRACT

## 2018-07-08 MED ORDER — ALBUTEROL SULFATE HFA 108 (90 BASE) MCG/ACT IN AERS: 1.0000 | INHALATION_SPRAY | Freq: Four times a day (QID) | RESPIRATORY_TRACT | 0 refills | Status: DC | PRN

## 2018-07-08 MED ORDER — IPRATROPIUM-ALBUTEROL 0.5-2.5 (3) MG/3ML IN SOLN
RESPIRATORY_TRACT | Status: AC
  Filled 2018-07-08: qty 3

## 2018-07-08 NOTE — ED Triage Notes (Signed)
Pt here for URI sx and diarrhea

## 2018-07-08 NOTE — Discharge Instructions (Signed)
Continue albuterol as needed Prednisone 30 mg daily with food Continue mucinex/zyrtec for congestion and drainage May use delsym or mucinex Dm for cough  Follow up if symptoms worsening or not improving

## 2018-07-08 NOTE — ED Provider Notes (Signed)
[facility]    CSN: [id number] Arrival date & time: 07/08/18  1727     History   Chief Complaint Chief Complaint  Patient presents with  . URI  . Diarrhea    HPI Rebecca Stevenson is a 48 y.o. female history of hypertension, asthma, presenting today for evaluation of URI symptoms and diarrhea.  Patient states that on Friday she started developed cough, congestion and sore throat.  Sore throat resolved after approximately 1 day.  She tried salt water gargles for this.  She has also had improvement in her diarrhea.  Denies any blood in stool.  Denies any nausea or vomiting.  Denies abdominal pain.  She has noted some wheezing associated with her cough.  She is tried taking mucus tablets without relief.  She also notes that she is ran out of her albuterol inhaler. Denies any recent travel, denies any known exposure to COVID-19.   HPI  Past Medical History:  Diagnosis Date  . Anxiety   . Asthma   . Hypertension   . Insomnia   . Muscle spasm   . Prediabetes 2016   Hgb A1c of 6.0 in 2016  . Speech impediment     Patient Active Problem List   Diagnosis Date Noted  . Anxiety state 05/21/2014  . Muscle spasm 05/21/2014  . Family history of diabetes mellitus (DM) 05/21/2014    Past Surgical History:  Procedure Laterality Date  . TUBAL LIGATION      OB History   No obstetric history on file.      Home Medications    Prior to Admission medications   Medication Sig Start Date End Date Taking? Authorizing Provider  albuterol (PROVENTIL HFA;VENTOLIN HFA) 108 (90 Base) MCG/ACT inhaler Inhale 1-2 puffs into the lungs every 6 (six) hours as needed for wheezing or shortness of breath. 07/08/18   ,  C, PA-C  Cetirizine HCl 10 MG CAPS Take 1 capsule (10 mg total) by mouth daily for 10 days. 07/08/18 07/18/18  ,  C, PA-C  hydrochlorothiazide (HYDRODIURIL) 25 MG tablet Take 1 tablet (25 mg total) by mouth daily. 02/23/18 03/25/18  Liberty Handy, PA-C  loperamide (IMODIUM) 2 MG capsule Take 1 capsule (2 mg total) by mouth 4 (four) times daily as needed for diarrhea or loose stools. 02/04/18   Hedges, Tinnie Gens, PA-C  meclizine (ANTIVERT) 12.5 MG tablet Take 1 tablet (12.5 mg total) by mouth 3 (three) times daily as needed for dizziness. 12/01/17   Palumbo, April, MD  naproxen (NAPROSYN) 500 MG tablet Take 1 tablet (500 mg total) by mouth 2 (two) times daily. 06/20/18   Georgetta Haber, NP  potassium chloride (K-DUR) 10 MEQ tablet Take 4 tablets (40 mEq total) by mouth daily for 10 days. 02/23/18 03/05/18  Liberty Handy, PA-C  predniSONE (DELTASONE) 10 MG tablet Take 3 tablets (30 mg total) by mouth daily with breakfast for 5 days. 07/08/18 07/13/18  , Junius Creamer, PA-C    Family History Family History  Problem Relation Age of Onset  . Diabetes Father   . Hypertension Father   . Heart disease Father   . Stroke Father   . Stuttering Father   . Cancer Sister   . Diabetes Maternal Grandmother   . Cancer Maternal Grandmother   . Hypertension Maternal Grandfather   . Hypertension Paternal Grandfather   . Mental illness Daughter     Social History Social History   Tobacco Use  . Smoking status: Former  Smoker  . Smokeless tobacco: Never Used  Substance Use Topics  . Alcohol use: Yes    Alcohol/week: 0.0 standard drinks    Comment: Occasional  . Drug use: No    Types: Marijuana     Allergies   Septra [sulfamethoxazole-trimethoprim] and Sulfa antibiotics   Review of Systems Review of Systems  Constitutional: Negative for activity change, appetite change, chills, fatigue and fever.  HENT: Positive for congestion and rhinorrhea. Negative for ear pain, sinus pressure, sore throat and trouble swallowing.   Eyes: Negative for discharge and redness.  Respiratory: Positive for cough and wheezing. Negative for chest tightness and shortness of breath.   Cardiovascular: Negative for chest pain.  Gastrointestinal: Negative  for abdominal pain, diarrhea, nausea and vomiting.  Musculoskeletal: Negative for myalgias.  Skin: Negative for rash.  Neurological: Negative for dizziness, light-headedness and headaches.     Physical Exam Triage Vital Signs ED Triage Vitals  Enc Vitals Group     BP 07/08/18 1749 131/85     Pulse Rate 07/08/18 1749 86     Resp 07/08/18 1749 18     Temp 07/08/18 1749 98.4 F (36.9 C)     Temp Source 07/08/18 1749 Oral     SpO2 07/08/18 1749 96 %     Weight --      Height --      Head Circumference --      Peak Flow --      Pain Score 07/08/18 1750 4     Pain Loc --      Pain Edu? --      Excl. in GC? --    No data found.  Updated Vital Signs BP 131/85 (BP Location: Right Arm)   Pulse 86   Temp 98.4 F (36.9 C) (Oral)   Resp 18   LMP 06/19/2018 (Exact Date)   SpO2 96%   Visual Acuity Right Eye Distance:   Left Eye Distance:   Bilateral Distance:    Right Eye Near:   Left Eye Near:    Bilateral Near:     Physical Exam Vitals signs and nursing note reviewed.  Constitutional:      General: She is not in acute distress.    Appearance: She is well-developed.  HENT:     Head: Normocephalic and atraumatic.     Ears:     Comments: Bilateral ears without tenderness to palpation of external auricle, tragus and mastoid, EAC's without erythema or swelling, TM's with good bony landmarks and cone of light. Non erythematous.    Nose:     Comments: Nasal mucosa slightly erythematous, swollen turbinates more superiorly    Mouth/Throat:     Comments: Oral mucosa pink and moist, no tonsillar enlargement or exudate. Posterior pharynx patent and nonerythematous, no uvula deviation or swelling. Normal phonation. Eyes:     Conjunctiva/sclera: Conjunctivae normal.  Neck:     Musculoskeletal: Neck supple.  Cardiovascular:     Rate and Rhythm: Normal rate and regular rhythm.     Heart sounds: No murmur.  Pulmonary:     Effort: Pulmonary effort is normal. No respiratory  distress.     Comments: Breathing comfortably at rest, mild coarseness noted to breath sounds with expiration, no distinctive wheezing auscultated Abdominal:     Palpations: Abdomen is soft.     Tenderness: There is no abdominal tenderness.  Skin:    General: Skin is warm and dry.  Neurological:     Mental Status: She is alert.  UC Treatments / Results  Labs (all labs ordered are listed, but only abnormal results are displayed) Labs Reviewed - No data to display  EKG None  Radiology No results found.  Procedures Procedures (including critical care time)  Medications Ordered in UC Medications  ipratropium-albuterol (DUONEB) 0.5-2.5 (3) MG/3ML nebulizer solution 3 mL (3 mLs Nebulization Given 07/08/18 1906)    Initial Impression / Assessment and Plan / UC Course  I have reviewed the triage vital signs and the nursing notes.  Pertinent labs & imaging results that were available during my care of the patient were reviewed by me and considered in my medical decision making (see chart for details).     Patient provided with DuoNeb in clinic per her request, refilled albuterol inhaler, will initiate on daily prednisone for 5 days at 30 mg.  Diarrhea resolving, continue to monitor, but she was recently on doxycycline for noticed approximately 1 week ago, if diarrhea not continuing to improve and resolve, recommended following up for further evaluation of diarrhea, possible C. difficile testing.  URI symptoms likely exacerbating asthma.  Will recommend symptomatic and supportive care.  Continue to monitor, Discussed strict return precautions. Patient verbalized understanding and is agreeable with plan.  Final Clinical Impressions(s) / UC Diagnoses   Final diagnoses:  Viral URI with cough  Diarrhea, unspecified type  Mild intermittent asthma with acute exacerbation     Discharge Instructions     Continue albuterol as needed Prednisone 30 mg daily with food Continue  mucinex/zyrtec for congestion and drainage May use delsym or mucinex Dm for cough  Follow up if symptoms worsening or not improving   ED Prescriptions    Medication Sig Dispense Auth. Provider   albuterol (PROVENTIL HFA;VENTOLIN HFA) 108 (90 Base) MCG/ACT inhaler Inhale 1-2 puffs into the lungs every 6 (six) hours as needed for wheezing or shortness of breath. 1 Inhaler ,  C, PA-C   predniSONE (DELTASONE) 10 MG tablet Take 3 tablets (30 mg total) by mouth daily with breakfast for 5 days. 15 tablet ,  C, PA-C   Cetirizine HCl 10 MG CAPS Take 1 capsule (10 mg total) by mouth daily for 10 days. 10 capsule ,  C, PA-C     Controlled Substance Prescriptions [place] Controlled Substance Registry consulted? Not Applicable   Lew Dawes, [place] 07/08/18 2102

## 2018-07-11 ENCOUNTER — Encounter: Payer: Self-pay | Admitting: Family Medicine

## 2018-07-11 ENCOUNTER — Ambulatory Visit: Payer: Self-pay | Attending: Family Medicine | Admitting: Family Medicine

## 2018-07-11 ENCOUNTER — Other Ambulatory Visit: Payer: Self-pay

## 2018-07-11 VITALS — BP 103/73 | HR 77 | Temp 98.7°F | Resp 18 | Ht 62.5 in | Wt 237.0 lb

## 2018-07-11 DIAGNOSIS — G479 Sleep disorder, unspecified: Secondary | ICD-10-CM

## 2018-07-11 DIAGNOSIS — J3089 Other allergic rhinitis: Secondary | ICD-10-CM

## 2018-07-11 DIAGNOSIS — J4530 Mild persistent asthma, uncomplicated: Secondary | ICD-10-CM

## 2018-07-11 DIAGNOSIS — F419 Anxiety disorder, unspecified: Secondary | ICD-10-CM

## 2018-07-11 MED ORDER — SERTRALINE HCL 50 MG PO TABS: 50.0000 mg | ORAL_TABLET | Freq: Every day | ORAL | 3 refills | Status: DC

## 2018-07-11 MED ORDER — MONTELUKAST SODIUM 10 MG PO TABS: 10.0000 mg | ORAL_TABLET | Freq: Every day | ORAL | 3 refills | Status: DC

## 2018-07-11 MED ORDER — ALBUTEROL SULFATE HFA 108 (90 BASE) MCG/ACT IN AERS: 1.0000 | INHALATION_SPRAY | Freq: Four times a day (QID) | RESPIRATORY_TRACT | 0 refills | Status: DC | PRN

## 2018-07-11 MED ORDER — CETIRIZINE HCL 10 MG PO CAPS: 10.0000 mg | ORAL_CAPSULE | Freq: Every day | ORAL | 0 refills | Status: DC

## 2018-07-11 MED ORDER — PREDNISONE 10 MG PO TABS: 30.0000 mg | ORAL_TABLET | Freq: Every day | ORAL | 0 refills | Status: AC

## 2018-07-11 NOTE — Progress Notes (Signed)
Established Patient Office Visit  Subjective:  Patient ID: Rebecca Stevenson, female    DOB: 1971/01/31  Age: 48 y.o. MRN: [id number]  CC: No chief complaint on file.   HPI Rebecca Stevenson, 48 yo female with medical history significant for asthma, who presents to establish care. Patient has had a recent increase in nasal congestion and postnasal drainage as well as an increase in asthma symptoms as her asthma is triggered by seasonal allergy symptoms. Patient denies any fever or chills. She has had some mild wheezing and the sensation of shortness of breath and mild chest tightness. Patient is currently only using an albuterol inhaler but was on singulair in the past as this helped with her asthma and allergy symptoms.       Patient also with complaint of fatigue and poor sleep. Patient wonders if she might have sleep apnea. Patient thinks that she snores and is often tired the next day despite how many hours she sleeps. Patient also wonders if some of her fatigue is related to her anxiety. Patient was on zoloft in the past which she did feel helped with her anxiety. Patient denies any suicidal thoughts or ideations. Patient would like to restart zoloft.   Past Medical History:  Diagnosis Date  . Anxiety   . Asthma   . Hypertension   . Insomnia   . Muscle spasm   . Prediabetes 2016   Hgb A1c of 6.0 in 2016  . Speech impediment     Past Surgical History:  Procedure Laterality Date  . TUBAL LIGATION      Family History  Problem Relation Age of Onset  . Diabetes Father   . Hypertension Father   . Heart disease Father   . Stroke Father   . Stuttering Father   . Cancer Sister   . Diabetes Maternal Grandmother   . Cancer Maternal Grandmother   . Hypertension Maternal Grandfather   . Hypertension Paternal Grandfather   . Mental illness Daughter     Social History   Socioeconomic History  . Marital status: Single    Spouse name: Not on file  . Number of children: Not  on file  . Years of education: Not on file  . Highest education level: Not on file  Occupational History  . Not on file  Social Needs  . Financial resource strain: Not on file  . Food insecurity:    Worry: Not on file    Inability: Not on file  . Transportation needs:    Medical: Not on file    Non-medical: Not on file  Tobacco Use  . Smoking status: Former Games developer  . Smokeless tobacco: Never Used  Substance and Sexual Activity  . Alcohol use: Yes    Alcohol/week: 0.0 standard drinks    Comment: Occasional  . Drug use: No    Types: Marijuana  . Sexual activity: Yes    Birth control/protection: Condom  Lifestyle  . Physical activity:    Days per week: Not on file    Minutes per session: Not on file  . Stress: Not on file  Relationships  . Social connections:    Talks on phone: Not on file    Gets together: Not on file    Attends religious service: Not on file    Active member of club or organization: Not on file    Attends meetings of clubs or organizations: Not on file    Relationship status: Not on file  .  Intimate partner violence:    Fear of current or ex partner: Not on file    Emotionally abused: Not on file    Physically abused: Not on file    Forced sexual activity: Not on file  Other Topics Concern  . Not on file  Social History Narrative   Lives with daughter and grandson in a two story home.  Does not work.  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  Education: some college.     Outpatient Medications Prior to Visit  Medication Sig Dispense Refill  . albuterol (PROVENTIL HFA;VENTOLIN HFA) 108 (90 Base) MCG/ACT inhaler Inhale 1-2 puffs into the lungs every 6 (six) hours as needed for wheezing or shortness of breath. 1 Inhaler 0  . Cetirizine HCl 10 MG CAPS Take 1 capsule (10 mg total) by mouth daily for 10 days. 10 capsule 0  . hydrochlorothiazide (HYDRODIURIL) 25 MG tablet Take 1 tablet (25 mg total) by mouth daily. 30 tablet 0  . loperamide (IMODIUM) 2 MG  capsule Take 1 capsule (2 mg total) by mouth 4 (four) times daily as needed for diarrhea or loose stools. 12 capsule 0  . meclizine (ANTIVERT) 12.5 MG tablet Take 1 tablet (12.5 mg total) by mouth 3 (three) times daily as needed for dizziness. 12 tablet 0  . naproxen (NAPROSYN) 500 MG tablet Take 1 tablet (500 mg total) by mouth 2 (two) times daily. 30 tablet 0  . potassium chloride (K-DUR) 10 MEQ tablet Take 4 tablets (40 mEq total) by mouth daily for 10 days. 40 tablet 0  . predniSONE (DELTASONE) 10 MG tablet Take 3 tablets (30 mg total) by mouth daily with breakfast for 5 days. 15 tablet 0   No facility-administered medications prior to visit.     Allergies  Allergen Reactions  . Septra [Sulfamethoxazole-Trimethoprim] Itching    Itching, fatigue, asthma flare up, tiredness, blood in urine.  . Sulfa Antibiotics     ROS Review of Systems  Constitutional: Positive for fatigue. Negative for chills and fever.  HENT: Positive for congestion, postnasal drip, rhinorrhea and sore throat. Negative for sinus pressure, sinus pain and trouble swallowing.   Eyes: Negative for photophobia and visual disturbance.  Respiratory: Positive for chest tightness (mild), shortness of breath (mild) and wheezing (mild). Negative for cough.   Cardiovascular: Negative for chest pain, palpitations and leg swelling.  Genitourinary: Negative for dysuria and frequency.  Musculoskeletal: Negative for arthralgias and back pain.  Neurological: Negative for dizziness and headaches.  Hematological: Negative for adenopathy. Does not bruise/bleed easily.      Objective:    Physical Exam  Constitutional: She is oriented to person, place, and time. She appears well-nourished.  obese  HENT:  Head: Normocephalic and atraumatic.  Right Ear: Hearing, tympanic membrane, external ear and ear canal normal.  Left Ear: Hearing, tympanic membrane, external ear and ear canal normal.  Nose: Mucosal edema and rhinorrhea (clear)  present.  Mouth/Throat: Posterior oropharyngeal edema and posterior oropharyngeal erythema present.  Eyes: Conjunctivae and EOM are normal.  Neck: Normal range of motion. Neck supple.  Cardiovascular: Normal rate and regular rhythm.  Pulmonary/Chest: Effort normal and breath sounds normal.  Abdominal: Soft. There is no abdominal tenderness. There is no rebound and no guarding.  Musculoskeletal: Normal range of motion.        General: No tenderness or edema.  Lymphadenopathy:    She has no cervical adenopathy.  Neurological: She is alert and oriented to person, place, and time.  Skin: Skin is warm and dry.  Psychiatric: She has a normal mood and affect. Her behavior is normal. Thought content normal.  Nursing note and vitals reviewed.  BP 103/73 (BP Location: Left Arm, Patient Position: Sitting, Cuff Size: Large)   Pulse 77   Temp 98.7 F (37.1 C) (Oral)   Resp 18   Ht 5' 2.5" (1.588 m)   Wt 237 lb (107.5 kg)   LMP 06/19/2018 (Exact Date)   SpO2 97%   BMI 42.66 kg/m  LMP 06/19/2018 (Exact Date)  Wt Readings from Last 3 Encounters:  02/04/18 240 lb (108.9 kg)  11/30/17 247 lb 6 oz (112.2 kg)  02/28/17 264 lb (119.7 kg)     Health Maintenance Due  Topic Date Due  . TETANUS/TDAP  01/12/1991  . PAP SMEAR-Modifier  01/11/1993  . INFLUENZA VACCINE  11/21/2017      Lab Results  Component Value Date   TSH 1.039 05/21/2014   Lab Results  Component Value Date   WBC 6.8 02/04/2018   HGB 11.9 (L) 02/23/2018   HCT 35.0 (L) 02/23/2018   MCV 90.8 02/04/2018   PLT 278 02/04/2018   Lab Results  Component Value Date   NA 141 02/23/2018   K 3.0 (L) 02/23/2018   CO2 28 02/04/2018   GLUCOSE 120 (H) 02/23/2018   BUN 4 (L) 02/23/2018   CREATININE 0.70 02/23/2018   BILITOT 0.6 02/04/2018   ALKPHOS 48 02/04/2018   AST 20 02/04/2018   ALT 20 02/04/2018   PROT 6.4 (L) 02/04/2018   ALBUMIN 3.2 (L) 02/04/2018   CALCIUM 8.6 (L) 02/04/2018   ANIONGAP 5 02/04/2018   No results  found for: CHOL No results found for: HDL No results found for: LDLCALC No results found for: TRIG No results found for: CHOLHDL Lab Results  Component Value Date   HGBA1C 6.0 (H) 05/21/2014      Assessment & Plan:  1. Mild persistent asthma without complication Patient with mild persistent asthma with recent increase in symptoms due to allergic rhinitis. Patient will be placed on a prednisone taper along with refill of albuterol along with restart of singulair which patient has taken in the past with good results to control asthma and allergic rhinitis. - predniSONE (DELTASONE) 10 MG tablet; Take 3 tablets (30 mg total) by mouth daily with breakfast for 5 days.  Dispense: 15 tablet; Refill: 0 - albuterol (PROVENTIL HFA;VENTOLIN HFA) 108 (90 Base) MCG/ACT inhaler; Inhale 1-2 puffs into the lungs every 6 (six) hours as needed for wheezing or shortness of breath.  Dispense: 1 Inhaler; Refill: 0 - montelukast (SINGULAIR) 10 MG tablet; Take 1 tablet (10 mg total) by mouth at bedtime.  Dispense: 30 tablet; Refill: 3  2. Allergic rhinitis due to other allergic trigger, unspecified seasonality RX provided for cetirizine to help with allergic rhinitis symptoms. Restart the use of singulair - Cetirizine HCl 10 MG CAPS; Take 1 capsule (10 mg total) by mouth daily for 10 days.  Dispense: 10 capsule; Refill: 0  3. Anxiety Patient with complaint of anxiety and states that she has taken zoloft in the past with good results and she would  - sertraline (ZOLOFT) 50 MG tablet; Take 1 tablet (50 mg total) by mouth daily. Start with 1/2 pill at bedtime for 2 weeks then increase to 1 whole pill  Dispense: 30 tablet; Refill: 3  4. Sleep disturbance Patient with complaint of snoring and daytime fatigue; patient will be scheduled for a sleep study for evaluation of  possible sleep apnea - Split night study; Future  An After Visit Summary was printed and given to the patient.  Follow-up: Return in about 6  weeks (around 08/22/2018) for asthma/anxiety.   Cain Saupe, MD

## 2018-07-16 ENCOUNTER — Encounter (HOSPITAL_COMMUNITY): Payer: Self-pay

## 2018-07-16 ENCOUNTER — Other Ambulatory Visit: Payer: Self-pay

## 2018-07-16 ENCOUNTER — Ambulatory Visit (HOSPITAL_COMMUNITY)
Admission: EM | Admit: 2018-07-16 | Discharge: 2018-07-16 | Disposition: A | Discharge status: Home / Self Care | Payer: Self-pay | Attending: Family Medicine | Admitting: Family Medicine

## 2018-07-16 DIAGNOSIS — J069 Acute upper respiratory infection, unspecified: Secondary | ICD-10-CM

## 2018-07-16 DIAGNOSIS — Z711 Person with feared health complaint in whom no diagnosis is made: Secondary | ICD-10-CM

## 2018-07-16 DIAGNOSIS — B9789 Other viral agents as the cause of diseases classified elsewhere: Secondary | ICD-10-CM

## 2018-07-16 NOTE — ED Triage Notes (Signed)
Pt presents to [facility] for return to work note, pt was seen in [facility] on 07/08/2018.

## 2018-07-16 NOTE — Discharge Instructions (Signed)
Work note completed Patient safe to return to work without restriction Call or go to ER if you have any new symptoms such as fever, chills, nausea, vomiting, chest pain, cough, shortness of breath, wheezing, abdominal pain, changes in bowel or bladder habits, etc..Marland Kitchen

## 2018-07-16 NOTE — ED Provider Notes (Signed)
[facility]   [id number] 07/16/18 Arrival Time: 1738   CC: Work note  SUBJECTIVE: History from: patient.  Rebecca Stevenson is a 48 y.o. female who presents for release to return to work.  Was seen on 07/08/18 for URI symptoms with underlying asthma.  Took medication as directed and to completion.  States symptoms have resolved.  Asymptomatic at this time.  Denies fever, chills, fatigue, sinus pain, rhinorrhea, sore throat, SOB, wheezing, chest pain, nausea, changes in bowel or bladder habits.    ROS: As per HPI.  Past Medical History:  Diagnosis Date  . Anxiety   . Asthma   . Hypertension   . Insomnia   . Muscle spasm   . Prediabetes 2016   Hgb A1c of 6.0 in 2016  . Speech impediment    Past Surgical History:  Procedure Laterality Date  . TUBAL LIGATION     Allergies  Allergen Reactions  . Septra [Sulfamethoxazole-Trimethoprim] Itching    Itching, fatigue, asthma flare up, tiredness, blood in urine.  . Sulfa Antibiotics    No current facility-administered medications on file prior to encounter.    Current Outpatient Medications on File Prior to Encounter  Medication Sig Dispense Refill  . albuterol (PROVENTIL HFA;VENTOLIN HFA) 108 (90 Base) MCG/ACT inhaler Inhale 1-2 puffs into the lungs every 6 (six) hours as needed for wheezing or shortness of breath. 1 Inhaler 0  . naproxen (NAPROSYN) 500 MG tablet Take 1 tablet (500 mg total) by mouth 2 (two) times daily. 30 tablet 0  . Cetirizine HCl 10 MG CAPS Take 1 capsule (10 mg total) by mouth daily for 10 days. 10 capsule 0  . hydrochlorothiazide (HYDRODIURIL) 25 MG tablet Take 1 tablet (25 mg total) by mouth daily. 30 tablet 0  . loperamide (IMODIUM) 2 MG capsule Take 1 capsule (2 mg total) by mouth 4 (four) times daily as needed for diarrhea or loose stools. 12 capsule 0  . meclizine (ANTIVERT) 12.5 MG tablet Take 1 tablet (12.5 mg total) by mouth 3 (three) times daily as needed for dizziness. 12 tablet 0  .  montelukast (SINGULAIR) 10 MG tablet Take 1 tablet (10 mg total) by mouth at bedtime. 30 tablet 3  . potassium chloride (K-DUR) 10 MEQ tablet Take 4 tablets (40 mEq total) by mouth daily for 10 days. 40 tablet 0  . predniSONE (DELTASONE) 10 MG tablet Take 3 tablets (30 mg total) by mouth daily with breakfast for 5 days. 15 tablet 0  . sertraline (ZOLOFT) 50 MG tablet Take 1 tablet (50 mg total) by mouth daily. Start with 1/2 pill at bedtime for 2 weeks then increase to 1 whole pill 30 tablet 3   Social History   Socioeconomic History  . Marital status: Single    Spouse name: Not on file  . Number of children: Not on file  . Years of education: Not on file  . Highest education level: Not on file  Occupational History  . Not on file  Social Needs  . Financial resource strain: Not on file  . Food insecurity:    Worry: Not on file    Inability: Not on file  . Transportation needs:    Medical: Not on file    Non-medical: Not on file  Tobacco Use  . Smoking status: Former Games developer  . Smokeless tobacco: Never Used  Substance and Sexual Activity  . Alcohol use: Yes    Alcohol/week: 0.0 standard drinks    Comment: Occasional  .  Drug use: No    Types: Marijuana  . Sexual activity: Yes    Birth control/protection: Condom  Lifestyle  . Physical activity:    Days per week: Not on file    Minutes per session: Not on file  . Stress: Not on file  Relationships  . Social connections:    Talks on phone: Not on file    Gets together: Not on file    Attends religious service: Not on file    Active member of club or organization: Not on file    Attends meetings of clubs or organizations: Not on file    Relationship status: Not on file  . Intimate partner violence:    Fear of current or ex partner: Not on file    Emotionally abused: Not on file    Physically abused: Not on file    Forced sexual activity: Not on file  Other Topics Concern  . Not on file  Social History Narrative   Lives  with daughter and grandson in a two story home.  Does not work.  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  Education: some college.    Family History  Problem Relation Age of Onset  . Asthma Mother   . Diabetes Father   . Hypertension Father   . Heart disease Father   . Stroke Father   . Stuttering Father   . Hypertension Sister   . Cancer Sister   . Diabetes Maternal Grandmother   . Cancer Maternal Grandmother   . Hypertension Maternal Grandfather   . Hypertension Paternal Grandfather   . Mental illness Daughter     OBJECTIVE:  Vitals:   07/16/18 1818 07/16/18 1820  BP: 106/72   Pulse: 81   Resp: 17   Temp: 97.8 F (36.6 C)   TempSrc: Oral   SpO2: 98% 98%     General appearance: alert; well-appearing; speaking in full sentences and tolerating own secretions HEENT: NCAT; Ears: EACs clear, TMs pearly gray; Eyes: PERRL.  EOM grossly intact. Nose: nares patent without rhinorrhea, Throat: oropharynx clear, tonsils non erythematous or enlarged, uvula midline  Neck: supple without LAD Lungs: unlabored respirations, symmetrical air entry; cough: absent; no respiratory distress; CTAB Heart: regular rate and rhythm.  Radial pulses 2+ symmetrical bilaterally Skin: warm and dry Psychological: alert and cooperative; normal mood and affect  ASSESSMENT & PLAN:  1. Viral URI with cough     Work note completed Patient safe to return to work without restriction Call or go to ER if you have any new symptoms such as fever, chills, nausea, vomiting, chest pain, cough, shortness of breath, wheezing, abdominal pain, changes in bowel or bladder habits, etc...  Reviewed expectations re: course of current medical issues. Questions answered. Outlined signs and symptoms indicating need for more acute intervention. Patient verbalized understanding. After Visit Summary given.         Rennis Harding, PA-C 07/16/18 1843

## 2018-08-18 MED FILL — MONTELUKAST SOD 10 MG TAB: 10 | 30 days supply | Qty: 30 | Fill #0

## 2018-08-18 MED FILL — !PROVENTIL HFA 90 MCG INH: 108 (90 BAS | 25 days supply | Qty: 1 | Fill #0

## 2018-08-18 MED FILL — SERTRALINE HCL 50 MG TABS: 50 | 33 days supply | Qty: 30 | Fill #0

## 2018-08-18 MED FILL — predniSONE 10 MG TABS: 10 | 5 days supply | Qty: 15 | Fill #0

## 2018-08-22 ENCOUNTER — Other Ambulatory Visit: Payer: Self-pay

## 2018-08-22 ENCOUNTER — Ambulatory Visit: Payer: Self-pay | Attending: Family Medicine | Admitting: Family Medicine

## 2018-09-23 ENCOUNTER — Other Ambulatory Visit (HOSPITAL_COMMUNITY): Admission: RE | Admit: 2018-09-23 | Payer: Self-pay | Source: Ambulatory Visit

## 2018-09-24 ENCOUNTER — Inpatient Hospital Stay (HOSPITAL_COMMUNITY): Admission: RE | Admit: 2018-09-24 | Payer: Self-pay | Source: Ambulatory Visit

## 2018-09-26 ENCOUNTER — Encounter (HOSPITAL_BASED_OUTPATIENT_CLINIC_OR_DEPARTMENT_OTHER): Payer: Self-pay

## 2018-12-04 ENCOUNTER — Ambulatory Visit: Payer: Self-pay | Attending: Family Medicine | Admitting: Physician Assistant

## 2018-12-04 DIAGNOSIS — I10 Essential (primary) hypertension: Secondary | ICD-10-CM

## 2018-12-04 DIAGNOSIS — F419 Anxiety disorder, unspecified: Secondary | ICD-10-CM

## 2018-12-04 DIAGNOSIS — M25552 Pain in left hip: Secondary | ICD-10-CM

## 2018-12-04 MED ORDER — SERTRALINE HCL 50 MG PO TABS: 50.0000 mg | ORAL_TABLET | Freq: Every day | ORAL | 3 refills | Status: DC

## 2018-12-04 MED ORDER — NAPROXEN 500 MG PO TABS: 500.0000 mg | ORAL_TABLET | Freq: Two times a day (BID) | ORAL | 1 refills | Status: DC

## 2018-12-04 MED ORDER — HYDROCHLOROTHIAZIDE 25 MG PO TABS: 25.0000 mg | ORAL_TABLET | Freq: Every day | ORAL | 5 refills | Status: DC

## 2018-12-04 MED ORDER — METHOCARBAMOL 500 MG PO TABS: 1000.0000 mg | ORAL_TABLET | Freq: Three times a day (TID) | ORAL | 1 refills | Status: DC | PRN

## 2018-12-04 MED FILL — METHOCARBAMOL 500 MG TABS: 500 | 15 days supply | Qty: 90 | Fill #0

## 2018-12-04 MED FILL — SERTRALINE HCL 50 MG TABS: 50 | 30 days supply | Qty: 30 | Fill #0

## 2018-12-04 MED FILL — HYDROCHLOROTHIAZIDE 25 MG T: 25 | 30 days supply | Qty: 30 | Fill #0

## 2018-12-04 NOTE — Progress Notes (Signed)
Patient has been called and DOB has been verified. Patient has been screened and transferred to PCP to start phone visit.  Patient is having pain in left hip, knee and arm.

## 2018-12-04 NOTE — Progress Notes (Signed)
Patient ID: Rebecca Stevenson, female   DOB: November 22, 1970, 48 y.o.   MRN: [id number] Virtual Visit via Telephone Note  I connected with Aleen Sells on 12/04/18 at  9:10 AM EDT by telephone and verified that I am speaking with the correct person using two identifiers.   I discussed the limitations, risks, security and privacy concerns of performing an evaluation and management service by telephone and the availability of in person appointments. I also discussed with the patient that there may be a patient responsible charge related to this service. The patient expressed understanding and agreed to proceed.  Patient location:  home My Location:  [place] office Persons on the call:  Me and the patient   History of Present Illness: Having some pain in L hip and L knee for several months.  Has limited mobility in L arm for years(~2016).    No fever.  NKI.  Aleve or ibuprofen helps some.  No numbness and weakness.    Off BP meds for a while bc didn't have RF.  BPs at home ~150-160/80-90.  No HA/CP/SOB/Dizziness.    Still on zoloft and stable.  Denies SI/HI.     Observations/Objective:  A&Ox3   Assessment and Plan: 1. Hypertension, unspecified type Not controlled-resume meds - Comprehensive metabolic panel; Future - CBC with Differential/Platelet; Future - Lipid panel; Future - hydrochlorothiazide (HYDRODIURIL) 25 MG tablet; Take 1 tablet (25 mg total) by mouth daily.  Dispense: 30 tablet; Refill: 5  2. Left hip pain With knee pain;  NKI - naproxen (NAPROSYN) 500 MG tablet; Take 1 tablet (500 mg total) by mouth 2 (two) times daily. Prn pain  Dispense: 60 tablet; Refill: 1 And Rx methocarbamol  3. Anxiety Stable-continue - sertraline (ZOLOFT) 50 MG tablet; Take 1 tablet (50 mg total) by mouth daily.  Dispense: 30 tablet; Refill: 3    Follow Up Instructions: See PCP in 3 months; sooner if needed.     I discussed the assessment and treatment plan with the patient. The patient  was provided an opportunity to ask questions and all were answered. The patient agreed with the plan and demonstrated an understanding of the instructions.   The patient was advised to call back or seek an in-person evaluation if the symptoms worsen or if the condition fails to improve as anticipated.  I provided 11 minutes of non-face-to-face time during this encounter.   Freeman Caldron, PA-C

## 2018-12-09 ENCOUNTER — Other Ambulatory Visit: Payer: Self-pay

## 2019-01-02 ENCOUNTER — Other Ambulatory Visit: Payer: Self-pay | Admitting: Family Medicine

## 2019-01-02 DIAGNOSIS — J4530 Mild persistent asthma, uncomplicated: Secondary | ICD-10-CM

## 2019-01-02 MED FILL — METHOCARBAMOL 500 MG TABS: 500 | 15 days supply | Qty: 90 | Fill #0

## 2019-01-02 MED FILL — ?NAPROXEN 500 MG TABS: 500 | 30 days supply | Qty: 60 | Fill #0

## 2019-01-02 MED FILL — SERTRALINE HCL 50 MG TABS: 50 | 33 days supply | Qty: 30 | Fill #1

## 2019-01-02 MED FILL — ?HYDROCHLOROTHIAZIDE 25MG T: 25 | 30 days supply | Qty: 30 | Fill #0

## 2019-01-05 MED FILL — PROVENTIL HFA 108 (90 BASE): 108 (90 BAS | 25 days supply | Qty: 7 | Fill #0

## 2019-03-25 ENCOUNTER — Emergency Department (HOSPITAL_COMMUNITY)
Admission: EM | Admit: 2019-03-25 | Discharge: 2019-03-25 | Disposition: A | Discharge status: Home / Self Care | Payer: Self-pay | Attending: Emergency Medicine | Admitting: Emergency Medicine

## 2019-03-25 ENCOUNTER — Emergency Department (HOSPITAL_COMMUNITY): Payer: Self-pay

## 2019-03-25 ENCOUNTER — Other Ambulatory Visit: Payer: Self-pay

## 2019-03-25 ENCOUNTER — Encounter (HOSPITAL_COMMUNITY): Payer: Self-pay

## 2019-03-25 DIAGNOSIS — T402X1A Poisoning by other opioids, accidental (unintentional), initial encounter: Secondary | ICD-10-CM | POA: Insufficient documentation

## 2019-03-25 DIAGNOSIS — Z87891 Personal history of nicotine dependence: Secondary | ICD-10-CM | POA: Insufficient documentation

## 2019-03-25 DIAGNOSIS — Z20828 Contact with and (suspected) exposure to other viral communicable diseases: Secondary | ICD-10-CM | POA: Insufficient documentation

## 2019-03-25 DIAGNOSIS — Z79899 Other long term (current) drug therapy: Secondary | ICD-10-CM | POA: Insufficient documentation

## 2019-03-25 DIAGNOSIS — D72829 Elevated white blood cell count, unspecified: Secondary | ICD-10-CM | POA: Insufficient documentation

## 2019-03-25 DIAGNOSIS — I10 Essential (primary) hypertension: Secondary | ICD-10-CM | POA: Insufficient documentation

## 2019-03-25 DIAGNOSIS — J45909 Unspecified asthma, uncomplicated: Secondary | ICD-10-CM | POA: Insufficient documentation

## 2019-03-25 LAB — CBC WITH DIFFERENTIAL/PLATELET
Abs Immature Granulocytes: 0.31 10*3/uL — ABNORMAL HIGH (ref 0.00–0.07)
Abs Immature Granulocytes: 0.04 10*3/uL (ref 0.00–0.07)
Basophils Absolute: 0.1 10*3/uL (ref 0.0–0.1)
Basophils Absolute: 0 10*3/uL (ref 0.0–0.1)
Basophils Relative: 0 %
Basophils Relative: 0 %
Eosinophils Absolute: 0 10*3/uL (ref 0.0–0.5)
Eosinophils Absolute: 0 10*3/uL (ref 0.0–0.5)
Eosinophils Relative: 0 %
Eosinophils Relative: 0 %
HCT: 38.2 % (ref 36.0–46.0)
HCT: 32.4 % — ABNORMAL LOW (ref 36.0–46.0)
Hemoglobin: 11.7 g/dL — ABNORMAL LOW (ref 12.0–15.0)
Hemoglobin: 10 g/dL — ABNORMAL LOW (ref 12.0–15.0)
Immature Granulocytes: 1 %
Immature Granulocytes: 0 %
Lymphocytes Relative: 2 %
Lymphocytes Relative: 11 %
Lymphs Abs: 0.5 10*3/uL — ABNORMAL LOW (ref 0.7–4.0)
Lymphs Abs: 1.3 10*3/uL (ref 0.7–4.0)
MCH: 28.1 pg (ref 26.0–34.0)
MCH: 28.2 pg (ref 26.0–34.0)
MCHC: 30.6 g/dL (ref 30.0–36.0)
MCHC: 30.9 g/dL (ref 30.0–36.0)
MCV: 91.8 fL (ref 80.0–100.0)
MCV: 91.3 fL (ref 80.0–100.0)
Monocytes Absolute: 1 10*3/uL (ref 0.1–1.0)
Monocytes Absolute: 0.8 10*3/uL (ref 0.1–1.0)
Monocytes Relative: 4 %
Monocytes Relative: 6 %
Neutro Abs: 24.6 10*3/uL — ABNORMAL HIGH (ref 1.7–7.7)
Neutro Abs: 9.9 10*3/uL — ABNORMAL HIGH (ref 1.7–7.7)
Neutrophils Relative %: 93 %
Neutrophils Relative %: 83 %
Platelets: 296 10*3/uL (ref 150–400)
Platelets: 271 10*3/uL (ref 150–400)
RBC: 4.16 MIL/uL (ref 3.87–5.11)
RBC: 3.55 MIL/uL — ABNORMAL LOW (ref 3.87–5.11)
RDW: 12.6 % (ref 11.5–15.5)
RDW: 12.8 % (ref 11.5–15.5)
WBC: 26.4 10*3/uL — ABNORMAL HIGH (ref 4.0–10.5)
WBC: 12.1 10*3/uL — ABNORMAL HIGH (ref 4.0–10.5)
nRBC: 0 % (ref 0.0–0.2)
nRBC: 0 % (ref 0.0–0.2)

## 2019-03-25 LAB — URINALYSIS, ROUTINE W REFLEX MICROSCOPIC
Bilirubin Urine: NEGATIVE
Glucose, UA: >=500 mg/dL — AB
Hgb urine dipstick: NEGATIVE
Ketones, ur: NEGATIVE mg/dL
Leukocytes,Ua: NEGATIVE
Nitrite: NEGATIVE
Protein, ur: 30 mg/dL — AB
Specific Gravity, Urine: 1.009 (ref 1.005–1.030)
pH: 7 (ref 5.0–8.0)

## 2019-03-25 LAB — RAPID URINE DRUG SCREEN, HOSP PERFORMED
Amphetamines: NOT DETECTED
Barbiturates: NOT DETECTED
Benzodiazepines: NOT DETECTED
Cocaine: POSITIVE — AB
Opiates: NOT DETECTED
Tetrahydrocannabinol: NOT DETECTED

## 2019-03-25 LAB — COMPREHENSIVE METABOLIC PANEL
ALT: 24 U/L (ref 0–44)
AST: 32 U/L (ref 15–41)
Albumin: 4.1 g/dL (ref 3.5–5.0)
Alkaline Phosphatase: 85 U/L (ref 38–126)
Anion gap: 17 — ABNORMAL HIGH (ref 5–15)
BUN: 13 mg/dL (ref 6–20)
CO2: 24 mmol/L (ref 22–32)
Calcium: 8.9 mg/dL (ref 8.9–10.3)
Chloride: 97 mmol/L — ABNORMAL LOW (ref 98–111)
Creatinine, Ser: 1.28 mg/dL — ABNORMAL HIGH (ref 0.44–1.00)
GFR calc Af Amer: 58 mL/min — ABNORMAL LOW (ref >60)
GFR calc non Af Amer: 50 mL/min — ABNORMAL LOW (ref >60)
Glucose, Bld: 315 mg/dL — ABNORMAL HIGH (ref 70–99)
Potassium: 4.4 mmol/L (ref 3.5–5.1)
Sodium: 138 mmol/L (ref 135–145)
Total Bilirubin: 0.1 mg/dL — ABNORMAL LOW (ref 0.3–1.2)
Total Protein: 8 g/dL (ref 6.5–8.1)

## 2019-03-25 LAB — BASIC METABOLIC PANEL
Anion gap: 9 (ref 5–15)
BUN: 14 mg/dL (ref 6–20)
CO2: 28 mmol/L (ref 22–32)
Calcium: 8.4 mg/dL — ABNORMAL LOW (ref 8.9–10.3)
Chloride: 102 mmol/L (ref 98–111)
Creatinine, Ser: 0.93 mg/dL (ref 0.44–1.00)
GFR calc Af Amer: >60 mL/min (ref >60)
GFR calc non Af Amer: >60 mL/min (ref >60)
Glucose, Bld: 115 mg/dL — ABNORMAL HIGH (ref 70–99)
Potassium: 4.2 mmol/L (ref 3.5–5.1)
Sodium: 139 mmol/L (ref 135–145)

## 2019-03-25 LAB — LACTIC ACID, PLASMA: Lactic Acid, Venous: 1.1 mmol/L (ref 0.5–1.9)

## 2019-03-25 LAB — ETHANOL: Alcohol, Ethyl (B): <10 mg/dL (ref <10)

## 2019-03-25 LAB — POC SARS CORONAVIRUS 2 AG -  ED: SARS Coronavirus 2 Ag: NEGATIVE

## 2019-03-25 LAB — SALICYLATE LEVEL: Salicylate Lvl: <7 mg/dL (ref 2.8–30.0)

## 2019-03-25 LAB — ACETAMINOPHEN LEVEL: Acetaminophen (Tylenol), Serum: <10 ug/mL — ABNORMAL LOW (ref 10–30)

## 2019-03-25 MED ORDER — SODIUM CHLORIDE 0.9 % IV BOLUS
1000.0000 mL | Freq: Once | INTRAVENOUS | Status: AC
  Administered 2019-03-25: 1000 mL via INTRAVENOUS

## 2019-03-25 MED ORDER — ONDANSETRON HCL 4 MG/2ML IJ SOLN
4.0000 mg | Freq: Once | INTRAMUSCULAR | Status: AC
  Administered 2019-03-25: 4 mg via INTRAVENOUS
  Filled 2019-03-25: qty 2

## 2019-03-25 MED ORDER — SODIUM CHLORIDE 0.9 % IV BOLUS: 1000.0000 mL | Freq: Once | INTRAVENOUS | Status: DC

## 2019-03-25 NOTE — ED Triage Notes (Signed)
Pt arrived via [facility] c/o drug overdose. Per EMS suspected ecstasy use. She had agonal respirations on scene, and GCS of 3. After 3mg  IN naloxone, patient status improved. Upon arrival at ED, patient had GCS of 15, alert and oriented (X2). VS: BP 118/66, HR 74, RR 12, CBG 460, SPO2 100% RA.

## 2019-03-25 NOTE — ED Notes (Signed)
Pt ambulated to the restroom with assistance on both sides. Pt states feeling dizzy while standing up but was able to walk after that. O2Sat 100% on RA.

## 2019-03-25 NOTE — ED Provider Notes (Addendum)
[facility]ENC[facility] Provider Note   CSN: [id number] Arrival date & time: 03/25/19  0547    History   Chief Complaint Chief Complaint  Patient presents with  . Drug Overdose    HPI Rebecca Stevenson is a 48 y.o. female.   The history is provided by the patient and the EMS personnel. The history is limited by the condition of the patient (Altered mental status).  Drug Overdose  She has history of hypertension and was brought in after possible drug overdose.  EMS reports they were called for possible ecstasy overdose, and that she had agonal breathing when she arrived and improved following administration of naloxone.  Patient's only complaint now is that she is cold.  She denies any drug use but does admit to drinking some Hennessy.  She denies hurting anywhere and she denies any shortness of breath, nausea, vomiting.  She denies exposure to COVID-19.  Past Medical History:  Diagnosis Date  . Anxiety   . Asthma   . Hypertension   . Insomnia   . Muscle spasm   . Prediabetes 2016   Hgb A1c of 6.0 in 2016  . Speech impediment     Patient Active Problem List   Diagnosis Date Noted  . Worried well 07/16/2018  . Anxiety state 05/21/2014  . Muscle spasm 05/21/2014  . Family history of diabetes mellitus (DM) 05/21/2014    Past Surgical History:  Procedure Laterality Date  . TUBAL LIGATION       OB History   No obstetric history on file.      Home Medications    Prior to Admission medications   Medication Sig Start Date End Date Taking? Authorizing Provider  Cetirizine HCl 10 MG CAPS Take 1 capsule (10 mg total) by mouth daily for 10 days. 07/11/18 07/21/18  Fulp, Cammie, MD  hydrochlorothiazide (HYDRODIURIL) 25 MG tablet Take 1 tablet (25 mg total) by mouth daily. 12/04/18 01/03/19  Anders SimmondsMcClung, Angela M, PA-C  loperamide (IMODIUM) 2 MG capsule Take 1 capsule (2 mg total) by mouth 4 (four) times daily as needed for diarrhea or loose stools.  02/04/18   Hedges, Tinnie GensJeffrey, PA-C  meclizine (ANTIVERT) 12.5 MG tablet Take 1 tablet (12.5 mg total) by mouth 3 (three) times daily as needed for dizziness. 12/01/17   Palumbo, April, MD  methocarbamol (ROBAXIN) 500 MG tablet Take 2 tablets (1,000 mg total) by mouth every 8 (eight) hours as needed for muscle spasms. 12/04/18   Anders SimmondsMcClung, Angela M, PA-C  montelukast (SINGULAIR) 10 MG tablet Take 1 tablet (10 mg total) by mouth at bedtime. 07/11/18   Fulp, Cammie, MD  naproxen (NAPROSYN) 500 MG tablet Take 1 tablet (500 mg total) by mouth 2 (two) times daily. Prn pain 12/04/18   Anders SimmondsMcClung, Angela M, PA-C  potassium chloride (K-DUR) 10 MEQ tablet Take 4 tablets (40 mEq total) by mouth daily for 10 days. 02/23/18 03/05/18  Liberty HandyGibbons, Claudia J, PA-C  PROVENTIL HFA 108 (90 Base) MCG/ACT inhaler INHALE 1-2 PUFFS INTO THE LUNGS EVERY 6 (SIX) HOURS AS NEEDED FOR WHEEZING OR SHORTNESS OF BREATH. 01/02/19   Hoy RegisterNewlin, Enobong, MD  sertraline (ZOLOFT) 50 MG tablet Take 1 tablet (50 mg total) by mouth daily. 12/04/18   Anders SimmondsMcClung, Angela M, PA-C    Family History Family History  Problem Relation Age of Onset  . Asthma Mother   . Diabetes Father   . Hypertension Father   . Heart disease Father   . Stroke Father   .  Stuttering Father   . Hypertension Sister   . Cancer Sister   . Diabetes Maternal Grandmother   . Cancer Maternal Grandmother   . Hypertension Maternal Grandfather   . Hypertension Paternal Grandfather   . Mental illness Daughter     Social History Social History   Tobacco Use  . Smoking status: Former Games developer  . Smokeless tobacco: Never Used  Substance Use Topics  . Alcohol use: Yes    Alcohol/week: 0.0 standard drinks    Comment: Occasional  . Drug use: No    Types: Marijuana     Allergies   Septra [sulfamethoxazole-trimethoprim] and Sulfa antibiotics   Review of Systems Review of Systems  Unable to perform ROS: Mental status change     Physical Exam Updated Vital Signs BP 140/76    Pulse 80   Resp 17   Ht 5\' 4"  (1.626 m)   Wt 120.2 kg   SpO2 100%   BMI 45.49 kg/m   Physical Exam Vitals signs and nursing note reviewed.    48 year old female, resting comfortably and in no acute distress. Vital signs are normal. Oxygen saturation is 100%, which is normal. Head is normocephalic and atraumatic. PERRLA, EOMI. Oropharynx is clear. Neck is nontender and supple without adenopathy or JVD. Back is nontender and there is no CVA tenderness. Lungs are clear without rales, wheezes, or rhonchi. Chest is nontender. Heart has regular rate and rhythm without murmur. Abdomen is soft, flat, nontender without masses or hepatosplenomegaly and peristalsis is normoactive. Extremities have no cyanosis or edema, full range of motion is present. Skin is warm and dry without rash. Neurologic: Awake and alert and oriented to person and place but not time, cranial nerves are intact, there are no motor or sensory deficits.  ED Treatments / Results  Labs (all labs ordered are listed, but only abnormal results are displayed) Labs Reviewed  COMPREHENSIVE METABOLIC PANEL  CBC WITH DIFFERENTIAL/PLATELET  ETHANOL  SALICYLATE LEVEL  ACETAMINOPHEN LEVEL  RAPID URINE DRUG SCREEN, HOSP PERFORMED  I-STAT BETA HCG BLOOD, ED ([facility], [facility], [facility] ONLY)   Procedures Procedures  CRITICAL CARE Performed by: 57 Total critical care time: 35 minutes Critical care time was exclusive of separately billable procedures and treating other patients. Critical care was necessary to treat or prevent imminent or life-threatening deterioration. Critical care was time spent personally by me on the following activities: development of treatment plan with patient and/or surrogate as well as nursing, discussions with consultants, evaluation of patient's response to treatment, examination of patient, obtaining history from patient or surrogate, ordering and performing treatments and interventions, ordering and review  of laboratory studies, ordering and review of radiographic studies, pulse oximetry and re-evaluation of patient's condition.  Medications Ordered in ED Medications - No data to display   Initial Impression / Assessment and Plan / ED Course  I have reviewed the triage vital signs and the nursing notes.  Pertinent labs & imaging results that were available during my care of the patient were reviewed by me and considered in my medical decision making (see chart for details).  Clinical Course as of Mar 25 2235  Wed Mar 25, 2019  0701 Pt signed out to me.  Breifly 48 yo female presenting as suspected opioid overdose, found unresponsive at home but responded to narcan per EMS, patient reporting she consumed etoh but denying opioid use.  Pt has been here approx 1 hour, plan to f/u on labs and monitor while here   [MT]  0956 IMPRESSION: Mucosal thickening in several ethmoid air cells. Study otherwise unremarkable.   [MT]  1020 Mentating improving.  Patient had an episode of vomiting at CT but feels better after zofran.  She is more awake now, but still sluggish.  She tells me she did take "half a pill" of ecstasy yesterday, but denies taking any other drugs.   [MT]  1217 Daughter now here, pt more awake, speech improved, reporting now that she "did cocaine" last night, and thinks it may have been laced with another substance.  I encouraged her to offer [place] a urine for a UDS to confirm this, but suspect this was likely an overdose   [MT]  1309 Patient ambulating to bathroom with assistance (2 persons), feels unsteady on feet.   [MT]    Clinical Course User Index [MT] Trifan, Carola Rhine, MD  Probable opioid overdose.  Old records are reviewed, and she has no relevant past visits.  She will be observed in the ED and will check screening labs including ethanol level and drug screen.  After 1 hour in the ED, she continues to be awake and maintaining adequate oxygen saturations.  WBC is noted to be  significantly elevated, felt to be a physiologic stress reaction.  Mild anemia is present which is unchanged from baseline.  Remainder of labs are pending.  Case is signed out to Dr. Langston Masker.  Final Clinical Impressions(s) / ED Diagnoses   Final diagnoses:  Opioid overdose, accidental or unintentional, initial encounter [facility])    ED Discharge Orders    None       Delora Fuel, MD 25/42/70 6237    Delora Fuel, MD 62/83/15 2236

## 2019-03-25 NOTE — ED Notes (Signed)
Pt throwing up in CT.

## 2019-03-25 NOTE — ED Notes (Signed)
Pt states that she was doing cocaine last night, and that it is something she doesn't normally do. Pt drowsy at this time, but is oriented and talking.

## 2019-03-25 NOTE — ED Notes (Signed)
Brewer pts daughter, would like an update

## 2019-03-25 NOTE — ED Notes (Signed)
Pt to CT with transporter.

## 2019-03-25 NOTE — Discharge Instructions (Signed)
Avoid doing drugs.   Stay hydrated   See your doctor  Return to ER if you have lethargy, passing out, vomiting, fever

## 2019-03-25 NOTE — ED Notes (Signed)
Patient transported to CT 

## 2019-03-25 NOTE — ED Notes (Signed)
Dr Trifan at bedside  

## 2019-03-25 NOTE — ED Provider Notes (Signed)
  Physical Exam  BP (!) 95/50   Pulse 68   Temp (!) 96 F (35.6 C) (Axillary)   Resp 16   Ht 5\' 4"  (1.626 m)   Wt 120.2 kg   LMP 03/11/2019   SpO2 100%   BMI 45.49 kg/m   Physical Exam  ED Course/Procedures   Clinical Course as of Mar 24 1718  Wed Mar 25, 2019  0701 Pt signed out to me.  Breifly 48 yo female presenting as suspected opioid overdose, found unresponsive at home but responded to narcan per EMS, patient reporting she consumed etoh but denying opioid use.  Pt has been here approx 1 hour, plan to f/u on labs and monitor while here   [MT]  0956 IMPRESSION: Mucosal thickening in several ethmoid air cells. Study otherwise unremarkable.   [MT]  1020 Mentating improving.  Patient had an episode of vomiting at CT but feels better after zofran.  She is more awake now, but still sluggish.  She tells me she did take "half a pill" of ecstasy yesterday, but denies taking any other drugs.   [MT]  1217 Daughter now here, pt more awake, speech improved, reporting now that she "did cocaine" last night, and thinks it may have been laced with another substance.  I encouraged her to offer [place] a urine for a UDS to confirm this, but suspect this was likely an overdose   [MT]  1309 Patient ambulating to bathroom with assistance (2 persons), feels unsteady on feet.   [MT]    Clinical Course User Index [MT] Trifan, Carola Rhine, MD    Procedures  MDM  Patient care assumed at 4 PM.  Patient is here after drug overdose.  Patient's UDS is positive for cocaine.  Patient is still not fully back to baseline around 3 PM.  Her CT head is negative and her white blood cell count is elevated and she has a mild anion gap acidosis.  Plan to reassess the patient.  5:20 PM  Ordered repeat labs which are reassuring. Her WBC is down to 12. Lactate normal. BMP showed nl AG now. Able to ambulate and mental status continues to improve. Will dc home and told her to avoid doing drugs and stay hydrated       Drenda Freeze, MD 03/25/19 1721

## 2019-03-25 NOTE — ED Provider Notes (Signed)
This note is intended to provide supplemental history only.  Please see Dr. Reynolds Bowl note for the primary medical providers evaluation.  I received the patient is a signout this morning.  I subsequently have seen the patient and also spoken to her daughter Nocole Zammit [phone]).   On my exam of the patient, she continues to be somnolent but arousable.  She has slurred speech and says she feels very tired.  She denies any headache or chest pain or shortness of breath.  She cannot provide me any additional history to me about yesterday's events.  She tells me she had 1 shot of Hennessy yesterday evening.  She denies to me that she took any medicines, pills or opioids.  She denies to me any history of seizures.  I also spoke to the patient's daughter Maiana Hennigan, who lives with the patient.  Chales Abrahams tells me that the patient came home late yesterday evening appearing somnolent and "not like herself".  She believes the patient may have taken an ecstasy pill.  She said she saw the patient walking around with the pill earlier in the day.  She cannot provide any clarity to me as to whom the patient gets the pills from.   She is unsure whether the patient may have been given pills "laced with something else" yesterday.  She denies to me that the patient has any known history of using opiates like heroin, fentanyl, or percocet before.  Chales Abrahams tells me the patient came home somnolent late in the evening.  The patient was woozy and had slurred speech.  She sat down on the couch.  She subsequently fell asleep and her breathing significantly slowed down.  She appeared apneic.  Chales Abrahams became alarmed by this and called 911.  She was concerned that the patient was "not breathing."  She was instructed to lower the patient on the floor, which she did.  She proceeded to give a few mouth-to-mouth breaths.  She was instructed by 911 dispatcher to start chest compressions, which she did for approximately 2-3  minutes.  She did not check pulses and unsure whether the patient was pulseless.  She states that thereafter the paramedics arrived.  They gave the patient a dose of intranasal Narcan, and within 1 minute the patient became more responsive and awake.  This is also consistent with the EMS report.  There was no note of pulselessness or cardiac arrest per EMS report to our nursing staff.  Chales Abrahams denies to me that the patient is ever used opioids.  She denies any known history of seizure disorder in the patient or herself.  I explained this history is very likely consistent with an opioid overdose.  I suspect the patient was apneic or hypopneic, but I doubt that she was in true cardiac arrest.  The fact that Narcan reversed her symptoms is also indicative of an opioid overdose.  We will continue to monitor the patient in the emergency department for any changes in mental status or deterioration.  This appears to be less likely seizure episode with no reported tonic-clonic activity at home, no prior history of seizures.  The patient's preceding somnolence and agonal breathing were suggestive of a drug toxidrome than a seizure to me.  Clinical Course as of Mar 24 1744  Wed Mar 25, 2019  0701 Pt signed out to me.  Breifly 48 yo female presenting as suspected opioid overdose, found unresponsive at home but responded to narcan  per EMS, patient reporting she consumed etoh but denying opioid use.  Pt has been here approx 1 hour, plan to f/u on labs and monitor while here   [MT]  0956 IMPRESSION: Mucosal thickening in several ethmoid air cells. Study otherwise unremarkable.   [MT]  1020 Mentating improving.  Patient had an episode of vomiting at CT but feels better after zofran.  She is more awake now, but still sluggish.  She tells me she did take "half a pill" of ecstasy yesterday, but denies taking any other drugs.   [MT]  1217 Daughter now here, pt more awake, speech improved, reporting now that she "did  cocaine" last night, and thinks it may have been laced with another substance.  I encouraged her to offer [place] a urine for a UDS to confirm this, but suspect this was likely an overdose   [MT]  1309 Patient ambulating to bathroom with assistance (2 persons), feels unsteady on feet.   [MT]    Clinical Course User Index [MT] Meliana Canner, Carola Rhine, MD   Signed out to Dr Darl Householder with plan to re-check labs and reassess WBC (suspect reactive leukocytosis initially) and lactate level.  If improving mentation and able to ambulate, can discharge.  Patient otherwise did eat and drink soda and crackers, no further emesis.    Wyvonnia Dusky, MD 03/25/19 [phone]

## 2019-03-25 NOTE — ED Notes (Signed)
Pt given ice chips per Dr. Langston Masker.

## 2019-03-30 LAB — CULTURE, BLOOD (ROUTINE X 2)
Culture: NO GROWTH
Culture: NO GROWTH
Special Requests: ADEQUATE

## 2019-11-06 ENCOUNTER — Ambulatory Visit: Payer: Self-pay

## 2019-11-06 ENCOUNTER — Ambulatory Visit
Admission: RE | Admit: 2019-11-06 | Discharge: 2019-11-06 | Discharge status: Against Medical Advice | Payer: Medicaid Other | Source: Ambulatory Visit

## 2019-11-06 ENCOUNTER — Emergency Department (HOSPITAL_COMMUNITY)
Admission: EM | Admit: 2019-11-06 | Discharge: 2019-11-07 | Disposition: A | Discharge status: Home / Self Care | Payer: Medicaid Other | Attending: Emergency Medicine | Admitting: Emergency Medicine

## 2019-11-06 ENCOUNTER — Ambulatory Visit (HOSPITAL_COMMUNITY): Payer: Self-pay

## 2019-11-06 ENCOUNTER — Other Ambulatory Visit: Payer: Self-pay

## 2019-11-06 ENCOUNTER — Encounter (HOSPITAL_COMMUNITY): Payer: Self-pay

## 2019-11-06 DIAGNOSIS — J45909 Unspecified asthma, uncomplicated: Secondary | ICD-10-CM | POA: Insufficient documentation

## 2019-11-06 DIAGNOSIS — I10 Essential (primary) hypertension: Secondary | ICD-10-CM | POA: Insufficient documentation

## 2019-11-06 DIAGNOSIS — Z87891 Personal history of nicotine dependence: Secondary | ICD-10-CM | POA: Insufficient documentation

## 2019-11-06 DIAGNOSIS — M79604 Pain in right leg: Secondary | ICD-10-CM | POA: Insufficient documentation

## 2019-11-06 DIAGNOSIS — Z79899 Other long term (current) drug therapy: Secondary | ICD-10-CM | POA: Insufficient documentation

## 2019-11-06 NOTE — ED Triage Notes (Addendum)
Pt arrives to ED w/ c/o 8/10 pain and edema to R calf since yesterday. Pt denies hx of blood clots. Pt denies taking blood thinners.

## 2019-11-06 NOTE — ED Notes (Signed)
Patient is being discharged from the Urgent Care and sent to the Emergency Department via POV . Per [place], patient is in need of higher level of care due to unilateral leg swelling. Patient is aware and verbalizes understanding of plan of care. There were no vitals filed for this visit.

## 2019-11-07 ENCOUNTER — Ambulatory Visit (HOSPITAL_COMMUNITY)
Admission: RE | Admit: 2019-11-07 | Discharge: 2019-11-07 | Disposition: A | Discharge status: Home / Self Care | Payer: Self-pay | Source: Ambulatory Visit | Attending: Emergency Medicine | Admitting: Emergency Medicine

## 2019-11-07 DIAGNOSIS — R52 Pain, unspecified: Secondary | ICD-10-CM

## 2019-11-07 DIAGNOSIS — M7989 Other specified soft tissue disorders: Secondary | ICD-10-CM | POA: Insufficient documentation

## 2019-11-07 DIAGNOSIS — M79604 Pain in right leg: Secondary | ICD-10-CM | POA: Insufficient documentation

## 2019-11-07 MED ORDER — RIVAROXABAN 15 MG PO TABS
15.0000 mg | ORAL_TABLET | Freq: Once | ORAL | Status: AC
  Administered 2019-11-07: 15 mg via ORAL
  Filled 2019-11-07: qty 1

## 2019-11-07 MED ORDER — RIVAROXABAN (XARELTO) VTE STARTER PACK (15 & 20 MG): ORAL_TABLET | ORAL | 0 refills | Status: DC

## 2019-11-07 NOTE — ED Provider Notes (Signed)
[facility] EMERGENCY DEPARTMENT Provider Note   CSN: [id number] Arrival date & time: 11/06/19  1832       PLEASE INFORM PATIENT OF RESULTS OF ULTRASOUND  History Chief Complaint  Patient presents with  . Leg Pain    Rebecca Stevenson is a 49 y.o. female.   Leg Pain Location:  Leg Injury: no   Leg location:  R lower leg Pain details:    Quality:  Aching and sharp   Radiates to:  Does not radiate   Severity:  No pain   Duration:  2 days   Timing:  Constant   Progression:  Worsening Chronicity:  New Prior injury to area:  No Relieved by:  None tried Worsened by:  Nothing      Past Medical History:  Diagnosis Date  . Anxiety   . Asthma   . Hypertension   . Insomnia   . Muscle spasm   . Prediabetes 2016   Hgb A1c of 6.0 in 2016  . Speech impediment     Patient Active Problem List   Diagnosis Date Noted  . Worried well 07/16/2018  . Anxiety state 05/21/2014  . Muscle spasm 05/21/2014  . Family history of diabetes mellitus (DM) 05/21/2014    Past Surgical History:  Procedure Laterality Date  . TUBAL LIGATION       OB History   No obstetric history on file.     Family History  Problem Relation Age of Onset  . Asthma Mother   . Diabetes Father   . Hypertension Father   . Heart disease Father   . Stroke Father   . Stuttering Father   . Hypertension Sister   . Cancer Sister   . Diabetes Maternal Grandmother   . Cancer Maternal Grandmother   . Hypertension Maternal Grandfather   . Hypertension Paternal Grandfather   . Mental illness Daughter     Social History   Tobacco Use  . Smoking status: Former Games developer  . Smokeless tobacco: Never Used  Substance Use Topics  . Alcohol use: Yes    Alcohol/week: 0.0 standard drinks    Comment: Occasional  . Drug use: No    Types: Marijuana    Home Medications Prior to Admission medications   Medication Sig Start Date End Date Taking? Authorizing Provider  Cetirizine HCl 10  MG CAPS Take 1 capsule (10 mg total) by mouth daily for 10 days. 07/11/18 07/21/18  Fulp, Cammie, MD  hydrochlorothiazide (HYDRODIURIL) 25 MG tablet Take 1 tablet (25 mg total) by mouth daily. 12/04/18 01/03/19  Anders Simmonds, PA-C  loperamide (IMODIUM) 2 MG capsule Take 1 capsule (2 mg total) by mouth 4 (four) times daily as needed for diarrhea or loose stools. 02/04/18   Hedges, Tinnie Gens, PA-C  meclizine (ANTIVERT) 12.5 MG tablet Take 1 tablet (12.5 mg total) by mouth 3 (three) times daily as needed for dizziness. 12/01/17   Palumbo, April, MD  methocarbamol (ROBAXIN) 500 MG tablet Take 2 tablets (1,000 mg total) by mouth every 8 (eight) hours as needed for muscle spasms. 12/04/18   Anders Simmonds, PA-C  montelukast (SINGULAIR) 10 MG tablet Take 1 tablet (10 mg total) by mouth at bedtime. 07/11/18   Fulp, Cammie, MD  naproxen (NAPROSYN) 500 MG tablet Take 1 tablet (500 mg total) by mouth 2 (two) times daily. Prn pain 12/04/18   Anders Simmonds, PA-C  potassium chloride (K-DUR) 10 MEQ tablet Take 4 tablets (40 mEq total) by mouth daily  for 10 days. 02/23/18 03/05/18  Liberty Handy, PA-C  PROVENTIL HFA 108 (90 Base) MCG/ACT inhaler INHALE 1-2 PUFFS INTO THE LUNGS EVERY 6 (SIX) HOURS AS NEEDED FOR WHEEZING OR SHORTNESS OF BREATH. 01/02/19   Hoy Register, MD  RIVAROXABAN (XARELTO) VTE STARTER PACK (15 & 20 MG TABLETS) Follow package directions: Take one 15mg  tablet by mouth twice a day. On day 22, switch to one 20mg  tablet once a day. Take with food. 11/07/19   Ardith Test, , MD  sertraline (ZOLOFT) 50 MG tablet Take 1 tablet (50 mg total) by mouth daily. 12/04/18   Barbara Cower, PA-C    Allergies    Septra [sulfamethoxazole-trimethoprim] and Sulfa antibiotics  Review of Systems   Review of Systems  All other systems reviewed and are negative.   Physical Exam Updated Vital Signs BP (!) 144/88   Pulse 73   Temp 98.8 F (37.1 C) (Oral)   Resp 16   SpO2 100%   Physical Exam Vitals  and nursing note reviewed.  Constitutional:      Appearance: She is well-developed.  HENT:     Head: Normocephalic and atraumatic.     Mouth/Throat:     Mouth: Mucous membranes are moist.     Pharynx: Oropharynx is clear.  Eyes:     Conjunctiva/sclera: Conjunctivae normal.     Pupils: Pupils are equal, round, and reactive to light.  Cardiovascular:     Rate and Rhythm: Normal rate and regular rhythm.  Pulmonary:     Effort: No respiratory distress.     Breath sounds: No stridor.  Abdominal:     General: Abdomen is flat. There is no distension.  Musculoskeletal:        General: Swelling (R>L) present. No tenderness, deformity or signs of injury. Normal range of motion.     Cervical back: Normal range of motion.     Right lower leg: Edema (R>L) present.     Left lower leg: Edema present.  Skin:    General: Skin is warm and dry.  Neurological:     General: No focal deficit present.     Mental Status: She is alert.  Psychiatric:        Mood and Affect: Mood normal.     ED Results / Procedures / Treatments   Labs (all labs ordered are listed, but only abnormal results are displayed) Labs Reviewed - No data to display  EKG None  Radiology No results found.  Procedures Procedures (including critical care time)  Medications Ordered in ED Medications  Rivaroxaban (XARELTO) tablet 15 mg (has no administration in time range)    ED Course  I have reviewed the triage vital signs and the nursing notes.  Pertinent labs & imaging results that were available during my care of the patient were reviewed by me and considered in my medical decision making (see chart for details).    MDM Rules/Calculators/A&P                          Covered w/ xarelto. 12/06/18 tomorrow, Rx provided for patient to fill if test is positive, either way will follow up with PCP for further management.   PLEASE INFORM PATIENT OF RESULTS OF ULTRASOUND  Final Clinical Impression(s) / ED  Diagnoses Final diagnoses:  Right leg pain    Rx / DC Orders ED Discharge Orders         Ordered    LE VENOUS  11/07/19 0249    RIVAROXABAN (XARELTO) VTE STARTER PACK (15 & 20 MG TABLETS)     Discontinue  Reprint     11/07/19 0249           Keondre Markson, Barbara Cower, MD 11/07/19 [phone]

## 2019-11-07 NOTE — Progress Notes (Signed)
VASCULAR LAB PRELIMINARY  PRELIMINARY  PRELIMINARY  PRELIMINARY  Right lower extremity venous duplex completed.    Preliminary report:  See CV proc for preliminary results.  Kinga Cassar, RVT 11/07/2019, 12:01 PM

## 2019-11-07 NOTE — ED Notes (Signed)
Discharge instructions discussed with pt. Pt verbalized understanding. Pt stable and ambulatory. No signature pad available. 

## 2019-11-26 ENCOUNTER — Telehealth: Payer: Self-pay | Admitting: General Practice

## 2019-11-26 NOTE — Telephone Encounter (Signed)
Patient is calling because she was a former patient of Vesta Mixer and is needing to schedule an appt for Prozac for Jenel Lucks. Please advise CB- [phone]

## 2019-12-04 NOTE — Telephone Encounter (Signed)
Call placed to patient. LCSW left message requesting a return call. Should patient return call, schedule an IBH appointment with LCSW discuss behavioral health resources.

## 2019-12-13 NOTE — Progress Notes (Deleted)
   Subjective:    Patient ID: Rebecca Stevenson, female    DOB: 1971-01-31, 49 y.o.   MRN: [id number]  HPI    Review of Systems     Objective:   Physical Exam        Assessment & Plan:

## 2019-12-14 ENCOUNTER — Ambulatory Visit: Payer: Medicaid Other | Admitting: Critical Care Medicine

## 2020-01-02 ENCOUNTER — Ambulatory Visit: Payer: Self-pay

## 2020-01-06 ENCOUNTER — Ambulatory Visit: Payer: Medicaid Other | Admitting: Physician Assistant

## 2020-04-04 ENCOUNTER — Ambulatory Visit: Payer: Self-pay

## 2020-08-19 ENCOUNTER — Ambulatory Visit (HOSPITAL_COMMUNITY): Admit: 2020-08-19 | Discharge status: Against Medical Advice | Payer: Medicaid Other | Source: Home / Self Care

## 2020-08-19 ENCOUNTER — Ambulatory Visit (HOSPITAL_COMMUNITY): Payer: Self-pay

## 2020-08-27 ENCOUNTER — Ambulatory Visit (HOSPITAL_COMMUNITY): Payer: Self-pay

## 2020-10-05 ENCOUNTER — Encounter (HOSPITAL_COMMUNITY): Payer: Self-pay | Admitting: Emergency Medicine

## 2020-10-05 ENCOUNTER — Other Ambulatory Visit: Payer: Self-pay

## 2020-10-05 ENCOUNTER — Ambulatory Visit (HOSPITAL_COMMUNITY)
Admission: EM | Admit: 2020-10-05 | Discharge: 2020-10-05 | Disposition: A | Discharge status: Home / Self Care | Payer: Self-pay | Attending: Urgent Care | Admitting: Urgent Care

## 2020-10-05 ENCOUNTER — Telehealth: Payer: Medicaid Other | Admitting: Physician Assistant

## 2020-10-05 DIAGNOSIS — M7989 Other specified soft tissue disorders: Secondary | ICD-10-CM

## 2020-10-05 DIAGNOSIS — I83891 Varicose veins of right lower extremities with other complications: Secondary | ICD-10-CM

## 2020-10-05 DIAGNOSIS — R030 Elevated blood-pressure reading, without diagnosis of hypertension: Secondary | ICD-10-CM

## 2020-10-05 DIAGNOSIS — K644 Residual hemorrhoidal skin tags: Secondary | ICD-10-CM

## 2020-10-05 MED ORDER — NAPROXEN 375 MG PO TABS: 375.0000 mg | ORAL_TABLET | Freq: Two times a day (BID) | ORAL | 0 refills | Status: DC

## 2020-10-05 NOTE — Discharge Instructions (Addendum)
I have very low suspicion for fracture, blood clot and therefore as long as you wear compression socks and take naproxen for pain and inflammation, I am okay with you returning to work. In general, due to standing at work for long periods of time try to wear compression socks at a 10-59mmHg pressure every day you work. Use naproxen twice daily with food as needed for pain and swelling. On your days off, try to be active but also try to prop your legs up at the level of your heart to help with swelling.

## 2020-10-05 NOTE — ED Provider Notes (Signed)
[facility]   MRN: [id number] DOB: 13-Jun-1970  Subjective:   Rebecca Stevenson is a 50 y.o. female presenting for acute onset this morning of right lower leg swelling with slight pain that radiates into the ankle.  Patient states that she was at work when this started and they advised that she get checked.  Currently in the clinic she has no pain.  Did not take any medications for relief.  Denies fall, trauma, warmth, erythema.  She was suspected of having a DVT in the past but her studies were negative and was found to have varicose veins of the right lower leg.  She is supposed to use compression socks but does not.  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  No current facility-administered medications for this encounter.  Current Outpatient Medications:    Cetirizine HCl 10 MG CAPS, Take 1 capsule (10 mg total) by mouth daily for 10 days., Disp: 10 capsule, Rfl: 0   hydrochlorothiazide (HYDRODIURIL) 25 MG tablet, Take 1 tablet (25 mg total) by mouth daily., Disp: 30 tablet, Rfl: 5   loperamide (IMODIUM) 2 MG capsule, Take 1 capsule (2 mg total) by mouth 4 (four) times daily as needed for diarrhea or loose stools., Disp: 12 capsule, Rfl: 0   meclizine (ANTIVERT) 12.5 MG tablet, Take 1 tablet (12.5 mg total) by mouth 3 (three) times daily as needed for dizziness., Disp: 12 tablet, Rfl: 0   methocarbamol (ROBAXIN) 500 MG tablet, Take 2 tablets (1,000 mg total) by mouth every 8 (eight) hours as needed for muscle spasms., Disp: 90 tablet, Rfl: 1   montelukast (SINGULAIR) 10 MG tablet, Take 1 tablet (10 mg total) by mouth at bedtime., Disp: 30 tablet, Rfl: 3   naproxen (NAPROSYN) 500 MG tablet, Take 1 tablet (500 mg total) by mouth 2 (two) times daily. Prn pain, Disp: 60 tablet, Rfl: 1   potassium chloride (K-DUR) 10 MEQ tablet, Take 4 tablets (40 mEq total) by mouth daily for 10 days., Disp: 40 tablet, Rfl: 0   PROVENTIL HFA 108 (90  Base) MCG/ACT inhaler, INHALE 1-2 PUFFS INTO THE LUNGS EVERY 6 (SIX) HOURS AS NEEDED FOR WHEEZING OR SHORTNESS OF BREATH., Disp: 1 g, Rfl: 0   RIVAROXABAN (XARELTO) VTE STARTER PACK (15 & 20 MG TABLETS), Follow package directions: Take one 15mg  tablet by mouth twice a day. On day 22, switch to one 20mg  tablet once a day. Take with food., Disp: 51 each, Rfl: 0   sertraline (ZOLOFT) 50 MG tablet, Take 1 tablet (50 mg total) by mouth daily., Disp: 30 tablet, Rfl: 3   Allergies  Allergen Reactions   Septra [Sulfamethoxazole-Trimethoprim] Itching    Itching, fatigue, asthma flare up, tiredness, blood in urine.   Sulfa Antibiotics     Past Medical History:  Diagnosis Date   Anxiety    Asthma    Hypertension    Insomnia    Muscle spasm    Prediabetes 2016   Hgb A1c of 6.0 in 2016   Speech impediment      Past Surgical History:  Procedure Laterality Date   TUBAL LIGATION      Family History  Problem Relation Age of Onset   Asthma Mother    Diabetes Father    Hypertension Father    Heart disease Father    Stroke Father    Stuttering Father    Hypertension Sister    Cancer Sister  Diabetes Maternal Grandmother    Cancer Maternal Grandmother    Hypertension Maternal Grandfather    Hypertension Paternal Grandfather    Mental illness Daughter     Social History   Tobacco Use   Smoking status: Former    Pack years: 0.00   Smokeless tobacco: Never  Substance Use Topics   Alcohol use: Yes    Alcohol/week: 0.0 standard drinks    Comment: Occasional   Drug use: No    Types: Marijuana    ROS   Objective:   Vitals: BP (!) 160/95 (BP Location: Left Arm)   Pulse 71   Temp 98.6 F (37 C) (Oral)   Resp 17   SpO2 98%   BP Readings from Last 3 Encounters:  10/05/20 (!) 160/95  11/07/19 (!) 144/88  03/25/19 114/63   Physical Exam Constitutional:      General: She is not in acute distress.    Appearance: Normal appearance. She is well-developed. She is obese. She  is not ill-appearing, toxic-appearing or diaphoretic.  HENT:     Head: Normocephalic and atraumatic.     Nose: Nose normal.     Mouth/Throat:     Mouth: Mucous membranes are moist.     Pharynx: Oropharynx is clear.  Eyes:     General: No scleral icterus.       Right eye: No discharge.        Left eye: No discharge.     Extraocular Movements: Extraocular movements intact.     Conjunctiva/sclera: Conjunctivae normal.     Pupils: Pupils are equal, round, and reactive to light.  Cardiovascular:     Rate and Rhythm: Normal rate.  Pulmonary:     Effort: Pulmonary effort is normal.  Musculoskeletal:     Right lower leg: Swelling (trace but without erythema, warmth) present. No deformity, lacerations, tenderness or bony tenderness. No edema.     Comments: Negative Homans' sign.  Skin:    General: Skin is warm and dry.  Neurological:     General: No focal deficit present.     Mental Status: She is alert and oriented to person, place, and time.     Motor: No weakness.     Coordination: Coordination normal.     Gait: Gait normal.     Deep Tendon Reflexes: Reflexes normal.  Psychiatric:        Mood and Affect: Mood normal.        Behavior: Behavior normal.        Thought Content: Thought content normal.        Judgment: Judgment normal.     Assessment and Plan :   PDMP not reviewed this encounter.  1. Varicose veins of leg with swelling, right   2. Right leg swelling   3. Elevated blood pressure reading     Recommended use of compression socks, naproxen for pain and inflammation.  Patient is okay to return to work.  Low suspicion for DVT.  Follow-up with PCP for recheck on blood pressure.  Counseled on hypertensive friendly foods.  Counseled patient on potential for adverse effects with medications prescribed/recommended today, ER and return-to-clinic precautions discussed, patient verbalized understanding.    Wallis Bamberg, PA-C 10/05/20 1040

## 2020-10-05 NOTE — ED Triage Notes (Signed)
Pt is present today with right calf and ankle pain. Pt states that she has noticed some swelling and numbness. Pt states she doesn't really experience pain just discomfort. Pt states that she noticed the discomfort two days ago

## 2020-10-06 ENCOUNTER — Other Ambulatory Visit: Payer: Self-pay

## 2020-10-06 MED ORDER — HYDROCORTISONE (PERIANAL) 2.5 % EX CREA
1.0000 "application " | TOPICAL_CREAM | Freq: Two times a day (BID) | CUTANEOUS | 0 refills | Status: DC
  Filled 2020-10-06 – 2021-06-05 (×4): qty 30, 10d supply, fill #0

## 2020-10-06 NOTE — Progress Notes (Signed)
E-Visit for Hemorrhoid  We are sorry that you are not feeling well. We are here to help!  Hemorrhoids are swollen veins in the rectum. They can cause itching, bleeding, and pain. Hemorrhoids are very common.  In some cases, you can see or feel hemorrhoids around the outside of the rectum. In other cases, you cannot see them because they are hidden inside the rectum. Be patient - It can take months for this to improve or go away.   Hemorrhoids do not always cause symptoms. But when they do, symptoms can include: ?Itching of the skin around the anus ?Bleeding - Bleeding is usually painless. You might see bright red blood after using the toilet. ?Pain - If a blood clot forms inside a hemorrhoid, this can cause pain. It can also cause a lump that you might be able to feel.   What can I do to keep from getting more hemorrhoids? -- The most important thing you can do is to keep from getting constipated. You should have a bowel movement at least a few times a week. When you have a bowel movement, you also should not have to push too much. Plus, your bowel movements should not be too hard. Being constipated and having hard bowel movements can make hemorrhoids worse.   I have prescribed Topical Hydrocortisone ointment 2.5%.  Apply to area two times per day for 30 days  HOME CARE: Sitz Baths twice daily. Soak buttocks in 2 or 3 inches of warm water for 10 to 15 minutes. Do not add soap, bubble bath, or anything to the water. Stool softener such as Colace 100 mg twice daily AND Miralax 1 scoop daily until you have regular soft stools Over the counter Preparation H Tucks Pads Witch Hazel  Here are some steps you can take to avoid getting constipated or having hard stools:  ?Eat lots of fruits, vegetables, and other foods with fiber. Fiber helps to increase bowel movements. If you do not get enough fiber from your diet, you can take fiber supplements. These come in the form of powders, wafers, or  pills. Some examples are Metamucil, Citrucel, Benefiber and FiberCon. If you take a fiber supplement, be sure to read the label so you know how much to take. If you're not sure, ask your provider or nurse. ?Take medicines called "stool softeners" such as docusate sodium (sample brand names: Colace, Dulcolax). These medicines increase the number of bowel movements you have. They are safe to take and they can prevent problems later.  You should request a referral for a follow up evaluation with a Gastroenterologist (GI doctor) to evaluate this chronic and relapsing condition - even if it improves to see what further steps need to be taken. This is highly linked to chronic constipation and straining to have a bowel movement. It may require further treatment or surgical intervention.   GET HELP RIGHT AWAY IF: You develop severe pain You have heavy bleeding   FOLLOW UP WITH YOUR PRIMARY PROVIDER IF: If your symptoms do not improve within 10 days  MAKE SURE YOU  Understand these instructions. Will watch your condition. Will get help right away if you are not doing well or get worse.  Your e-visit answers were reviewed by a board certified advanced clinical practitioner to complete your personal care plan. Depending upon the condition, your plan could have included both over the counter or prescription medications.  Your safety is important to [place]. If you have drug allergies check your prescription  carefully.   You can use MyChart to ask questions about today's visit, request a non-urgent call back, or ask for a work or school excuse for 24 hours related to this e-Visit. If it has been greater than 24 hours you will need to follow up with your provider, or enter a new e-Visit to address those concerns.  You will get an e-mail with a link to a survey asking about your experience.  We hope that your e-visit has been valuable and will speed your recovery! Thank you for using e-visits.

## 2020-10-06 NOTE — Progress Notes (Signed)
I have spent 5 minutes in review of e-visit questionnaire, review and updating patient chart, medical decision making and response to patient.   Kiyla Ringler Cody Babbie Dondlinger, PA-C    

## 2020-10-10 ENCOUNTER — Ambulatory Visit (HOSPITAL_COMMUNITY)
Admission: RE | Admit: 2020-10-10 | Discharge: 2020-10-10 | Disposition: A | Discharge status: Home / Self Care | Payer: Medicaid Other | Source: Ambulatory Visit | Attending: Urgent Care | Admitting: Urgent Care

## 2020-10-10 ENCOUNTER — Other Ambulatory Visit: Payer: Self-pay

## 2020-10-10 ENCOUNTER — Encounter (HOSPITAL_COMMUNITY): Payer: Self-pay

## 2020-10-10 VITALS — BP 132/83 | HR 81 | Temp 98.4°F | Resp 18

## 2020-10-10 DIAGNOSIS — R110 Nausea: Secondary | ICD-10-CM

## 2020-10-10 DIAGNOSIS — R42 Dizziness and giddiness: Secondary | ICD-10-CM

## 2020-10-10 DIAGNOSIS — I10 Essential (primary) hypertension: Secondary | ICD-10-CM

## 2020-10-10 DIAGNOSIS — R030 Elevated blood-pressure reading, without diagnosis of hypertension: Secondary | ICD-10-CM

## 2020-10-10 MED ORDER — AMLODIPINE BESYLATE 2.5 MG PO TABS
2.5000 mg | ORAL_TABLET | Freq: Every day | ORAL | 0 refills | Status: DC
  Filled 2020-10-10: qty 30, 30d supply, fill #0

## 2020-10-10 NOTE — ED Triage Notes (Signed)
Pt presents with recurrent elevated blood pressure X 1 week with some mild dizziness and nausea.

## 2020-10-10 NOTE — Discharge Instructions (Addendum)

## 2020-10-10 NOTE — ED Provider Notes (Signed)
[facility]   MRN: [id number] DOB: 06-24-1970  Subjective:   Rebecca Stevenson is a 50 y.o. female presenting for 1 week history of persistent elevations in her blood pressure. Has had some intermittent dizziness, nausea without vomiting.  Was recently seen and had elevation in her blood pressure then.  She has not been able to follow-up with her PCP.  States that they gave her an appointment for 2 to 3 months down the road.  Would like to restart blood pressure medication as she was previously taken off of this because it went back to normal.  However, in her checks lately it has been elevated to 140 systolic or higher.  Denies any active headache, confusion, dizziness, weakness, numbness or tingling, chest pain, shortness of breath, abdominal pain.  No current facility-administered medications for this encounter.  Current Outpatient Medications:    amLODipine (NORVASC) 2.5 MG tablet, Take 1 tablet (2.5 mg total) by mouth daily., Disp: 90 tablet, Rfl: 0   hydrocortisone (ANUSOL-HC) 2.5 % rectal cream, Place 1 application rectally 2 (two) times daily., Disp: 30 g, Rfl: 0   naproxen (NAPROSYN) 375 MG tablet, Take 1 tablet (375 mg total) by mouth 2 (two) times daily with a meal., Disp: 30 tablet, Rfl: 0   PROVENTIL HFA 108 (90 Base) MCG/ACT inhaler, INHALE 1-2 PUFFS INTO THE LUNGS EVERY 6 (SIX) HOURS AS NEEDED FOR WHEEZING OR SHORTNESS OF BREATH., Disp: 1 g, Rfl: 0   RIVAROXABAN (XARELTO) VTE STARTER PACK (15 & 20 MG TABLETS), Follow package directions: Take one 15mg  tablet by mouth twice a day. On day 22, switch to one 20mg  tablet once a day. Take with food., Disp: 51 each, Rfl: 0   Allergies  Allergen Reactions   Septra [Sulfamethoxazole-Trimethoprim] Itching    Itching, fatigue, asthma flare up, tiredness, blood in urine.   Sulfa Antibiotics     Past Medical History:  Diagnosis Date   Anxiety    Asthma    Hypertension    Insomnia    Muscle spasm    Prediabetes  2016   Hgb A1c of 6.0 in 2016   Speech impediment      Past Surgical History:  Procedure Laterality Date   TUBAL LIGATION      Family History  Problem Relation Age of Onset   Asthma Mother    Diabetes Father    Hypertension Father    Heart disease Father    Stroke Father    Stuttering Father    Hypertension Sister    Cancer Sister    Diabetes Maternal Grandmother    Cancer Maternal Grandmother    Hypertension Maternal Grandfather    Hypertension Paternal Grandfather    Mental illness Daughter     Social History   Tobacco Use   Smoking status: Former    Pack years: 0.00   Smokeless tobacco: Never  Substance Use Topics   Alcohol use: Yes    Alcohol/week: 0.0 standard drinks    Comment: Occasional   Drug use: No    Types: Marijuana    ROS   Objective:   Vitals: BP 132/83 (BP Location: Left Arm)   Pulse 81   Temp 98.4 F (36.9 C) (Oral)   Resp 18   SpO2 98%   BP Readings from Last 3 Encounters:  10/10/20 132/83  10/05/20 (!) 160/95  11/07/19 (!) 144/88     Physical Exam Constitutional:      General: She is not in acute distress.  Appearance: Normal appearance. She is well-developed. She is not ill-appearing, toxic-appearing or diaphoretic.  HENT:     Head: Normocephalic and atraumatic.     Nose: Nose normal.     Mouth/Throat:     Mouth: Mucous membranes are moist.  Eyes:     Extraocular Movements: Extraocular movements intact.     Pupils: Pupils are equal, round, and reactive to light.  Cardiovascular:     Rate and Rhythm: Normal rate and regular rhythm.     Pulses: Normal pulses.     Heart sounds: Normal heart sounds. No murmur heard.   No friction rub. No gallop.  Pulmonary:     Effort: Pulmonary effort is normal. No respiratory distress.     Breath sounds: Normal breath sounds. No stridor. No wheezing, rhonchi or rales.  Skin:    General: Skin is warm and dry.     Findings: No rash.  Neurological:     Mental Status: She is alert  and oriented to person, place, and time.     Cranial Nerves: No cranial nerve deficit.     Motor: No weakness.     Coordination: Coordination normal.     Gait: Gait normal.     Deep Tendon Reflexes: Reflexes normal.     Comments: Negative Romberg and pronator drift.  Psychiatric:        Mood and Affect: Mood normal.        Behavior: Behavior normal.        Thought Content: Thought content normal.     Assessment and Plan :   PDMP not reviewed this encounter.  1. Essential hypertension   2. Elevated blood pressure reading   3. Dizziness   4. Nausea without vomiting     We will restart patient on a low-dose amlodipine.  Provided her with a 90-day refill to bridge her appointment to her PCP. Counseled patient on potential for adverse effects with medications prescribed/recommended today, ER and return-to-clinic precautions discussed, patient verbalized understanding.    Wallis Bamberg, PA-C 10/10/20 1215

## 2020-10-13 ENCOUNTER — Other Ambulatory Visit: Payer: Self-pay

## 2020-10-20 ENCOUNTER — Ambulatory Visit: Payer: Medicaid Other

## 2020-10-20 ENCOUNTER — Other Ambulatory Visit: Payer: Self-pay

## 2020-10-25 ENCOUNTER — Other Ambulatory Visit: Payer: Self-pay

## 2020-10-25 ENCOUNTER — Encounter (HOSPITAL_COMMUNITY): Payer: Self-pay

## 2020-10-25 ENCOUNTER — Emergency Department (HOSPITAL_COMMUNITY)
Admission: EM | Admit: 2020-10-25 | Discharge: 2020-10-25 | Disposition: A | Discharge status: Home / Self Care | Payer: Medicaid Other | Attending: Emergency Medicine | Admitting: Emergency Medicine

## 2020-10-25 DIAGNOSIS — Z7901 Long term (current) use of anticoagulants: Secondary | ICD-10-CM | POA: Insufficient documentation

## 2020-10-25 DIAGNOSIS — K0889 Other specified disorders of teeth and supporting structures: Secondary | ICD-10-CM

## 2020-10-25 DIAGNOSIS — Z87891 Personal history of nicotine dependence: Secondary | ICD-10-CM | POA: Insufficient documentation

## 2020-10-25 DIAGNOSIS — K029 Dental caries, unspecified: Secondary | ICD-10-CM | POA: Insufficient documentation

## 2020-10-25 DIAGNOSIS — I10 Essential (primary) hypertension: Secondary | ICD-10-CM | POA: Insufficient documentation

## 2020-10-25 DIAGNOSIS — J45909 Unspecified asthma, uncomplicated: Secondary | ICD-10-CM | POA: Insufficient documentation

## 2020-10-25 DIAGNOSIS — Z79899 Other long term (current) drug therapy: Secondary | ICD-10-CM | POA: Insufficient documentation

## 2020-10-25 MED ORDER — OXYCODONE-ACETAMINOPHEN 5-325 MG PO TABS
1.0000 | ORAL_TABLET | Freq: Once | ORAL | Status: AC
Start: 2020-10-25 — End: 2020-10-25
  Administered 2020-10-25: 1 via ORAL
  Filled 2020-10-25: qty 1

## 2020-10-25 MED ORDER — HYDROCODONE-ACETAMINOPHEN 5-325 MG PO TABS: 1.0000 | ORAL_TABLET | Freq: Four times a day (QID) | ORAL | 0 refills | Status: DC | PRN

## 2020-10-25 MED ORDER — AMOXICILLIN-POT CLAVULANATE 875-125 MG PO TABS
1.0000 | ORAL_TABLET | Freq: Once | ORAL | Status: AC
  Administered 2020-10-25: 1 via ORAL
  Filled 2020-10-25: qty 1

## 2020-10-25 MED ORDER — AMOXICILLIN-POT CLAVULANATE 875-125 MG PO TABS: 1.0000 | ORAL_TABLET | Freq: Two times a day (BID) | ORAL | 0 refills | Status: DC

## 2020-10-25 NOTE — ED Triage Notes (Signed)
Upper left dental pain X 1 month. Temp 99.5 at home per patient

## 2020-10-25 NOTE — ED Provider Notes (Signed)
[facility]-EMERGENCY DEPT Provider Note   CSN: [id number] Arrival date & time: 10/25/20  1235     History Chief Complaint  Patient presents with   Dental Pain    Rebecca Stevenson is a 50 y.o. female.   Dental Pain Location:  Upper Quality:  Aching Severity:  Moderate Onset quality:  Gradual Duration:  1 month Timing:  Constant Progression:  Worsening Context: filling fell out   Relieved by:  Nothing Worsened by:  Nothing Ineffective treatments:  NSAIDs Associated symptoms: no difficulty swallowing, no drooling and no fever       Past Medical History:  Diagnosis Date   Anxiety    Asthma    Hypertension    Insomnia    Muscle spasm    Prediabetes 2016   Hgb A1c of 6.0 in 2016   Speech impediment     Patient Active Problem List   Diagnosis Date Noted   Worried well 07/16/2018   Anxiety state 05/21/2014   Muscle spasm 05/21/2014   Family history of diabetes mellitus (DM) 05/21/2014    Past Surgical History:  Procedure Laterality Date   TUBAL LIGATION       OB History   No obstetric history on file.     Family History  Problem Relation Age of Onset   Asthma Mother    Diabetes Father    Hypertension Father    Heart disease Father    Stroke Father    Stuttering Father    Hypertension Sister    Cancer Sister    Diabetes Maternal Grandmother    Cancer Maternal Grandmother    Hypertension Maternal Grandfather    Hypertension Paternal Grandfather    Mental illness Daughter     Social History   Tobacco Use   Smoking status: Former    Pack years: 0.00   Smokeless tobacco: Never  Substance Use Topics   Alcohol use: Yes    Alcohol/week: 0.0 standard drinks    Comment: Occasional   Drug use: No    Types: Marijuana    Home Medications Prior to Admission medications   Medication Sig Start Date End Date Taking? Authorizing Provider  amoxicillin-clavulanate (AUGMENTIN) 875-125 MG tablet Take 1 tablet by mouth every 12  (twelve) hours. 10/25/20  Yes Almena Hokenson, PA-C  HYDROcodone-acetaminophen (NORCO/VICODIN) 5-325 MG tablet Take 1 tablet by mouth every 6 (six) hours as needed. 10/25/20  Yes Kaymen Adrian, PA-C  amLODipine (NORVASC) 2.5 MG tablet Take 1 tablet (2.5 mg total) by mouth daily. 10/10/20   Wallis Bamberg, PA-C  hydrocortisone (ANUSOL-HC) 2.5 % rectal cream Place 1 application rectally 2 (two) times daily. 10/06/20   Waldon Merl, PA-C  PROVENTIL HFA 108 (90 Base) MCG/ACT inhaler INHALE 1-2 PUFFS INTO THE LUNGS EVERY 6 (SIX) HOURS AS NEEDED FOR WHEEZING OR SHORTNESS OF BREATH. 01/02/19   Hoy Register, MD  RIVAROXABAN (XARELTO) VTE STARTER PACK (15 & 20 MG TABLETS) Follow package directions: Take one 15mg  tablet by mouth twice a day. On day 22, switch to one 20mg  tablet once a day. Take with food. 11/07/19   Mesner, , MD    Allergies    Septra [sulfamethoxazole-trimethoprim] and Sulfa antibiotics  Review of Systems   Review of Systems  Constitutional:  Negative for chills and fever.  HENT:  Positive for dental problem. Negative for drooling, trouble swallowing and voice change.   Respiratory:  Negative for shortness of breath.    Physical Exam Updated Vital Signs BP (!) 154/114 (  BP Location: Right Arm)   Pulse 80   Temp 98.7 F (37.1 C) (Oral)   Resp 18   Ht 5\' 4"  (1.626 m)   Wt 113.4 kg   SpO2 99%   BMI 42.91 kg/m   Physical Exam Vitals and nursing note reviewed.  Constitutional:      General: She is not in acute distress.    Appearance: She is well-developed. She is not diaphoretic.  HENT:     Head: Normocephalic and atraumatic.     Mouth/Throat:     Dentition: Abnormal dentition. Does not have dentures. Dental caries present. No dental tenderness, gingival swelling or dental abscesses.      Comments: No facial, neck or cheek swelling noted. No pooling of secretions or trismus.  Normal voice noted with no difficulty swallowing or breathing.  No submandibular erythema, edema or  crepitus noted Eyes:     General: No scleral icterus.    Conjunctiva/sclera: Conjunctivae normal.  Pulmonary:     Effort: Pulmonary effort is normal. No respiratory distress.  Musculoskeletal:     Cervical back: Normal range of motion.  Skin:    Findings: No rash.  Neurological:     Mental Status: She is alert.  .  ED Results / Procedures / Treatments   Labs (all labs ordered are listed, but only abnormal results are displayed) Labs Reviewed - No data to display  EKG None  Radiology No results found.  Procedures Procedures   Medications Ordered in ED Medications  oxyCODONE-acetaminophen (PERCOCET/ROXICET) 5-325 MG per tablet 1 tablet (1 tablet Oral Given 10/25/20 1314)  amoxicillin-clavulanate (AUGMENTIN) 875-125 MG per tablet 1 tablet (1 tablet Oral Given 10/25/20 1314)    ED Course  I have reviewed the triage vital signs and the nursing notes.  Pertinent labs & imaging results that were available during my care of the patient were reviewed by me and considered in my medical decision making (see chart for details).    MDM Rules/Calculators/A&P                          Patient with dentalgia. On exam, there is no evidence of a drainable abscess. No trismus, glossal elevation, unilateral tonsillar swelling. No evidence of retropharyngeal or peritonsillar abscess or Ludwig angina. Will treat with Augmentin and pain medication. Pt instructed to follow-up with dentist as soon as possible. Resource guide provided with AVS.   Patient is hemodynamically stable, in NAD, and able to ambulate in the ED. Evaluation does not show pathology that would require ongoing emergent intervention or inpatient treatment. I explained the diagnosis to the patient. Pain has been managed and has no complaints prior to discharge. Patient is comfortable with above plan and is stable for discharge at this time. All questions were answered prior to disposition. Strict return precautions for returning to  the ED were discussed. Encouraged follow up with PCP.   Prior to providing a prescription for a controlled substance, I independently reviewed the patient's recent prescription history on the 12/26/20 Controlled Substance Reporting System. The patient had no recent or regular prescriptions and was deemed appropriate for a brief, less than 3 day prescription of narcotic for acute analgesia.  An After Visit Summary was printed and given to the patient.   Portions of this note were generated with [place]. Dictation errors may occur despite best attempts at proofreading.  Final Clinical Impression(s) / ED Diagnoses Final diagnoses:  Pain, dental  Rx / DC Orders ED Discharge Orders          Ordered    HYDROcodone-acetaminophen (NORCO/VICODIN) 5-325 MG tablet  Every 6 hours PRN        10/25/20 1314    amoxicillin-clavulanate (AUGMENTIN) 875-125 MG tablet  Every 12 hours        10/25/20 1314             Dietrich Pates, PA-C 10/25/20 1314    Linwood Dibbles, MD 10/26/20 0900

## 2020-10-25 NOTE — Discharge Instructions (Addendum)
Take the medication to help with your infection. Take the pain medicine as needed. Follow-up with your your dentist or the 1 listed below. Return to the ER if you start to experience worsening pain, swelling, trouble breathing, trouble swallowing.

## 2021-04-19 ENCOUNTER — Ambulatory Visit: Payer: Medicaid Other

## 2021-04-21 ENCOUNTER — Ambulatory Visit (HOSPITAL_COMMUNITY): Payer: Medicaid Other

## 2021-04-25 ENCOUNTER — Ambulatory Visit (HOSPITAL_COMMUNITY): Payer: Medicaid Other

## 2021-05-01 ENCOUNTER — Ambulatory Visit (HOSPITAL_COMMUNITY): Payer: Medicaid Other

## 2021-05-04 ENCOUNTER — Encounter (HOSPITAL_COMMUNITY): Payer: Self-pay

## 2021-05-04 ENCOUNTER — Ambulatory Visit (HOSPITAL_COMMUNITY)
Admission: RE | Admit: 2021-05-04 | Discharge: 2021-05-04 | Disposition: A | Discharge status: Home / Self Care | Payer: Commercial Managed Care - HMO | Source: Ambulatory Visit | Attending: Internal Medicine | Admitting: Internal Medicine

## 2021-05-04 ENCOUNTER — Other Ambulatory Visit: Payer: Self-pay

## 2021-05-04 VITALS — BP 126/80 | HR 80 | Temp 97.9°F | Resp 22

## 2021-05-04 DIAGNOSIS — Z711 Person with feared health complaint in whom no diagnosis is made: Secondary | ICD-10-CM | POA: Insufficient documentation

## 2021-05-04 DIAGNOSIS — Z113 Encounter for screening for infections with a predominantly sexual mode of transmission: Secondary | ICD-10-CM | POA: Diagnosis not present

## 2021-05-04 LAB — HIV ANTIBODY (ROUTINE TESTING W REFLEX): HIV Screen 4th Generation wRfx: NONREACTIVE

## 2021-05-04 NOTE — ED Triage Notes (Signed)
Muscle spasms in hands and legs x one month.  Asthma started bothering patient 2 months ago, treated self with inhaler.    Plasma screening sent patient a letter reporting she has syphilis antibodies on screening and recommended she get checked out.    Patient has been researching syphilis symptoms and has concerns for several bumps, hair falling out that she thinks is related to a syphilis diagnosis   Patient does not have a pcp

## 2021-05-04 NOTE — ED Provider Notes (Signed)
[name]    CSN: [id number] Arrival date & time: 05/04/21  1103      History   Chief Complaint Chief Complaint  Patient presents with   Appointment    11:00   Spasms    HPI Rebecca Stevenson is a 51 y.o. female with multiple complaints. 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 Will take motrin sometimes with some relief.  Also states she is worried that "syphilis is attacking her lungs".  No formal diagnosis of syphilis.  However, states she received a letter from the plasma bank stating her screening revealed syphilis antibodies.  Patient reports occasional headaches, intermittent fever/chills, body aches, intermittent chest pain and loose stool.  She denies any recent abdominal pain.  Denies any current chest pain.  She denies any suspicious rashes or lesions.  No history of genital lesions or STI infections.  Patient requesting STI screening.  Does not have a PCP but would like to establish care.    Past Medical History:  Diagnosis Date   Anxiety    Asthma    Hypertension    Insomnia    Muscle spasm    Prediabetes 2016   Hgb A1c of 6.0 in 2016   Speech impediment     Patient Active Problem List   Diagnosis Date Noted   Worried well 07/16/2018   Anxiety state 05/21/2014   Muscle spasm 05/21/2014   Family history of diabetes mellitus (DM) 05/21/2014    Past Surgical History:  Procedure Laterality Date   TUBAL LIGATION      OB History   No obstetric history on file.      Home Medications    Prior to Admission medications   Medication Sig Start Date End Date Taking? Authorizing Provider  amLODipine (NORVASC) 2.5 MG tablet Take 1 tablet (2.5 mg total) by mouth daily. Patient not taking: Reported on 05/04/2021 10/10/20   Jaynee Eagles, PA-C  amoxicillin-clavulanate (AUGMENTIN) 875-125 MG tablet Take 1 tablet by mouth every 12 (twelve) hours. Patient not taking: Reported on 05/04/2021 10/25/20   Delia Heady, PA-C  HYDROcodone-acetaminophen (NORCO/VICODIN) 5-325 MG tablet Take 1 tablet by mouth every 6 (six) hours as needed. Patient not taking: Reported on 05/04/2021 10/25/20   Delia Heady, PA-C  hydrocortisone (ANUSOL-HC) 2.5 % rectal cream Place 1 application rectally 2 (two) times daily. 10/06/20   Brunetta Jeans, PA-C  PROVENTIL HFA 108 (90 Base) MCG/ACT inhaler INHALE 1-2 PUFFS INTO THE LUNGS EVERY 6 (SIX) HOURS AS NEEDED FOR WHEEZING OR SHORTNESS OF BREATH. 01/02/19   Charlott Rakes, MD  RIVAROXABAN (XARELTO) VTE STARTER PACK (15 & 20 MG TABLETS) Follow package directions: Take one 15mg  tablet by mouth twice a day. On day 22, switch to one 20mg  tablet once a day. Take with food. Patient not taking: Reported on 05/04/2021 11/07/19   Mesner, Corene Cornea, MD    Family History Family History  Problem Relation Age of Onset   Asthma Mother    Diabetes Father    Hypertension Father    Heart disease Father    Stroke Father    Stuttering Father    Hypertension Sister    Cancer Sister    Diabetes Maternal Grandmother    Cancer Maternal Grandmother    Hypertension Maternal Grandfather    Hypertension Paternal Grandfather    Mental illness Daughter     Social History Social History   Tobacco Use   Smoking status: Former  Smokeless tobacco: Never  Vaping Use   Vaping Use: Never used  Substance Use Topics   Alcohol use: Yes    Alcohol/week: 0.0 standard drinks    Comment: Occasional   Drug use: No    Types: Marijuana     Allergies   Septra [sulfamethoxazole-trimethoprim] and Sulfa antibiotics   Review of Systems As stated in HPI otherwise negative   Physical Exam Triage Vital Signs ED Triage Vitals  Enc Vitals Group     BP 05/04/21 1150 126/80     Pulse Rate 05/04/21 1150 80     Resp 05/04/21 1150 (!) 22     Temp 05/04/21 1150 97.9 F (36.6 C)     Temp Source 05/04/21 1150 Oral     SpO2 05/04/21 1150 96 %     Weight --      Height --      Head Circumference --       Peak Flow --      Pain Score 05/04/21 1146 7     Pain Loc --      Pain Edu? --      Excl. in [place]? --    No data found.  Updated Vital Signs BP 126/80 (BP Location: Right Arm) Comment (BP Location): large cuff   Pulse 80    Temp 97.9 F (36.6 C) (Oral)    Resp (!) 22    LMP 04/13/2021    SpO2 96%   Visual Acuity Right Eye Distance:   Left Eye Distance:   Bilateral Distance:    Right Eye Near:   Left Eye Near:    Bilateral Near:     Physical Exam Constitutional:      General: She is not in acute distress.    Appearance: Normal appearance. She is not ill-appearing or toxic-appearing.  HENT:     Right Ear: Tympanic membrane normal.     Left Ear: Tympanic membrane normal.     Nose: No congestion or rhinorrhea.     Mouth/Throat:     Mouth: Mucous membranes are moist.     Pharynx: No oropharyngeal exudate or posterior oropharyngeal erythema.  Eyes:     General: No scleral icterus.    Extraocular Movements: Extraocular movements intact.     Conjunctiva/sclera: Conjunctivae normal.  Neck:     Vascular: No carotid bruit.  Cardiovascular:     Rate and Rhythm: Normal rate and regular rhythm.     Heart sounds: No murmur heard.   No friction rub. No gallop.  Pulmonary:     Effort: Pulmonary effort is normal.     Breath sounds: Normal breath sounds. No wheezing, rhonchi or rales.  Abdominal:     General: Bowel sounds are normal.     Palpations: Abdomen is soft.     Tenderness: There is no abdominal tenderness. There is no right CVA tenderness, left CVA tenderness or guarding.  Musculoskeletal:        General: Normal range of motion.     Cervical back: Normal range of motion and neck supple. No rigidity.  Lymphadenopathy:     Cervical: No cervical adenopathy.  Skin:    General: Skin is warm and dry.     Findings: No lesion or rash.  Neurological:     General: No focal deficit present.     Mental Status: She is alert and oriented to person, place, and time.  Psychiatric:         Mood and Affect: Mood normal.  Behavior: Behavior normal.     UC Treatments / Results  Labs (all labs ordered are listed, but only abnormal results are displayed) Labs Reviewed  RPR  HIV ANTIBODY (ROUTINE TESTING W REFLEX)  CERVICOVAGINAL ANCILLARY ONLY    EKG   Radiology No results found.  Procedures Procedures (including critical care time)  Medications Ordered in UC Medications - No data to display  Initial Impression / Assessment and Plan / UC Course  I have reviewed the triage vital signs and the nursing notes.  Pertinent labs & imaging results that were available during my care of the patient were reviewed by me and considered in my medical decision making (see chart for details).  Worried well STI screening -Patient with multiple concerns today.  VSS, nontoxic appearing with reassuring physical exam -She would likely benefit from PCP referral to manage her complaints -In the meantime we will check STI screening, RPR, HIV per patient request  Reviewed expections re: course of current medical issues. Questions answered. Outlined signs and symptoms indicating need for more acute intervention. Pt verbalized understanding. AVS given  Final Clinical Impressions(s) / UC Diagnoses   Final diagnoses:  Routine screening for STI (sexually transmitted infection)  Physically well but worried     Discharge Instructions      I am reassured by your exam today.  Your lungs sound clear and your vital signs are stable.  Your STI screening will take approximately 2 to 3 days to result.  You will be able to get those results via your MyChart.  We will call you should you require any treatment.  I have also asked our team to reach out to you to help establish a primary care provider as you may need a more extensive work-up than we are able to provide at urgent care.     ED Prescriptions   None    PDMP not reviewed this encounter.   Rudolpho Sevin,  NP 05/04/21 1408

## 2021-05-04 NOTE — Discharge Instructions (Addendum)
I am reassured by your exam today.  Your lungs sound clear and your vital signs are stable.  Your STI screening will take approximately 2 to 3 days to result.  You will be able to get those results via your MyChart.  We will call you should you require any treatment.  I have also asked our team to reach out to you to help establish a primary care provider as you may need a more extensive work-up than we are able to provide at urgent care.

## 2021-05-05 ENCOUNTER — Other Ambulatory Visit (HOSPITAL_COMMUNITY): Payer: Self-pay

## 2021-05-05 ENCOUNTER — Other Ambulatory Visit: Payer: Self-pay

## 2021-05-05 ENCOUNTER — Telehealth (HOSPITAL_COMMUNITY): Payer: Self-pay | Admitting: Emergency Medicine

## 2021-05-05 LAB — CERVICOVAGINAL ANCILLARY ONLY
Bacterial Vaginitis (gardnerella): POSITIVE — AB
Chlamydia: NEGATIVE
Comment: NEGATIVE
Comment: NEGATIVE
Comment: NEGATIVE
Comment: NORMAL
Neisseria Gonorrhea: NEGATIVE
Trichomonas: POSITIVE — AB

## 2021-05-05 LAB — RPR: RPR Ser Ql: NONREACTIVE

## 2021-05-05 MED ORDER — METRONIDAZOLE 500 MG PO TABS
500.0000 mg | ORAL_TABLET | Freq: Two times a day (BID) | ORAL | 0 refills | Status: DC
  Filled 2021-05-05: qty 14, 7d supply, fill #0

## 2021-05-25 ENCOUNTER — Ambulatory Visit (HOSPITAL_COMMUNITY): Payer: Managed Care, Other (non HMO)

## 2021-06-01 ENCOUNTER — Other Ambulatory Visit: Payer: Self-pay

## 2021-06-01 ENCOUNTER — Ambulatory Visit (HOSPITAL_COMMUNITY)
Admission: EM | Admit: 2021-06-01 | Discharge: 2021-06-01 | Disposition: A | Discharge status: Home / Self Care | Payer: Commercial Managed Care - HMO | Attending: Internal Medicine | Admitting: Internal Medicine

## 2021-06-01 ENCOUNTER — Encounter (HOSPITAL_COMMUNITY): Payer: Self-pay

## 2021-06-01 DIAGNOSIS — G5601 Carpal tunnel syndrome, right upper limb: Secondary | ICD-10-CM | POA: Diagnosis not present

## 2021-06-01 DIAGNOSIS — Z113 Encounter for screening for infections with a predominantly sexual mode of transmission: Secondary | ICD-10-CM | POA: Diagnosis not present

## 2021-06-01 MED ORDER — PREDNISONE 20 MG PO TABS
40.0000 mg | ORAL_TABLET | Freq: Every day | ORAL | 0 refills | Status: AC
  Filled 2021-06-01 – 2021-06-08 (×2): qty 10, 5d supply, fill #0

## 2021-06-01 MED ORDER — METHOCARBAMOL 500 MG PO TABS
500.0000 mg | ORAL_TABLET | Freq: Every evening | ORAL | 0 refills | Status: DC | PRN
  Filled 2021-06-01 – 2021-06-08 (×2): qty 10, 10d supply, fill #0

## 2021-06-01 NOTE — ED Triage Notes (Signed)
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

## 2021-06-01 NOTE — Discharge Instructions (Addendum)
Please wear your wrist splint during sleep Take medications as prescribed Icing of the right wrist after work If symptoms worsen please return to urgent care to be reevaluated We will call you with recommendations if labs abnormal Please do not drive or operate heavy machinery after taking muscle relaxants.  Muscle relaxants makes you drowsy.

## 2021-06-02 ENCOUNTER — Other Ambulatory Visit: Payer: Self-pay

## 2021-06-02 LAB — CERVICOVAGINAL ANCILLARY ONLY
Bacterial Vaginitis (gardnerella): POSITIVE — AB
Candida Glabrata: NEGATIVE
Candida Vaginitis: POSITIVE — AB
Chlamydia: NEGATIVE
Comment: NEGATIVE
Comment: NEGATIVE
Comment: NEGATIVE
Comment: NEGATIVE
Comment: NEGATIVE
Comment: NORMAL
Neisseria Gonorrhea: NEGATIVE
Trichomonas: NEGATIVE

## 2021-06-02 NOTE — ED Provider Notes (Addendum)
[facility]    CSN: [id number] Arrival date & time: 06/01/21  1752      History   Chief Complaint Chief Complaint  Patient presents with   Thumb Pain    HPI Rebecca Stevenson is a 51 y.o. female comes to the urgent care with complaints of right thumb pain of 3 days duration.  Patient's job requires repetitive hand movements.  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  She complains of pain in the left and wrist.  Pain is worse after work and seems to improve in the morning.  She is complaining of numbness in the thumb index and middle fingers of the right hand.  No wrist swelling.  No trauma to the wrist.  Patient has taken anti-inflammatories with no improvement in symptoms.  He denies using a wrist brace..  Patient was recently  treated for trichomonas and bacterial vaginosis.  She is requesting repeat test.  Patient also complains about spasms and cramps in her legs after standing for the whole day.  No calf pain or tenderness.  Patient endorses stretching before and after work.  Patient is requesting a trial of muscle relaxant.   HPI  Past Medical History:  Diagnosis Date   Anxiety    Asthma    Hypertension    Insomnia    Muscle spasm    Prediabetes 2016   Hgb A1c of 6.0 in 2016   Speech impediment     Patient Active Problem List   Diagnosis Date Noted   Worried well 07/16/2018   Anxiety state 05/21/2014   Muscle spasm 05/21/2014   Family history of diabetes mellitus (DM) 05/21/2014    Past Surgical History:  Procedure Laterality Date   TUBAL LIGATION      OB History   No obstetric history on file.      Home Medications    Prior to Admission medications   Medication Sig Start Date End Date Taking? Authorizing Provider  methocarbamol (ROBAXIN) 500 MG tablet Take 1 tablet (500 mg total) by mouth at bedtime as needed for muscle spasms. 06/01/21  Yes Margarito Dehaas, Britta Mccreedy, MD  predniSONE (DELTASONE) 20 MG  tablet Take 2 tablets (40 mg total) by mouth daily for 5 days. 06/01/21 06/07/21 Yes Griselle Rufer, Britta Mccreedy, MD  hydrocortisone (ANUSOL-HC) 2.5 % rectal cream Place 1 application rectally 2 (two) times daily. 10/06/20   Waldon Merl, PA-C  PROVENTIL HFA 108 [phone] Base) MCG/ACT inhaler INHALE 1-2 PUFFS INTO THE LUNGS EVERY 6 (SIX) HOURS AS NEEDED FOR WHEEZING OR SHORTNESS OF BREATH. 01/02/19   Hoy Register, MD    Family History Family History  Problem Relation Age of Onset   Asthma Mother    Diabetes Father    Hypertension Father    Heart disease Father    Stroke Father    Stuttering Father    Hypertension Sister    Cancer Sister    Diabetes Maternal Grandmother    Cancer Maternal Grandmother    Hypertension Maternal Grandfather    Hypertension Paternal Grandfather    Mental illness Daughter     Social History Social History   Tobacco Use   Smoking status: Former   Smokeless tobacco: Never  Building services engineer Use: Never used  Substance Use Topics   Alcohol use: Yes    Alcohol/week: 0.0 standard drinks    Comment: Occasional   Drug use: No    Types: Marijuana  Allergies   Septra [sulfamethoxazole-trimethoprim] and Sulfa antibiotics   Review of Systems Review of Systems  Musculoskeletal:  Positive for arthralgias. Negative for joint swelling and myalgias.  Skin: Negative.     Physical Exam Triage Vital Signs ED Triage Vitals  Enc Vitals Group     BP 06/01/21 1937 (!) 148/94     Pulse Rate 06/01/21 1937 79     Resp 06/01/21 1937 18     Temp 06/01/21 1937 98.7 F (37.1 C)     Temp Source 06/01/21 1937 Oral     SpO2 06/01/21 1937 98 %     Weight --      Height --      Head Circumference --      Peak Flow --      Pain Score 06/01/21 1935 6     Pain Loc --      Pain Edu? --      Excl. in [place]? --    No data found.  Updated Vital Signs BP (!) 148/94 (BP Location: Left Arm)    Pulse 79    Temp 98.7 F (37.1 C) (Oral)    Resp 18    SpO2 98%   Visual  Acuity Right Eye Distance:   Left Eye Distance:   Bilateral Distance:    Right Eye Near:   Left Eye Near:    Bilateral Near:     Physical Exam Vitals and nursing note reviewed.  Constitutional:      General: She is in acute distress.     Appearance: Normal appearance.  Cardiovascular:     Rate and Rhythm: Normal rate and regular rhythm.     Pulses: Normal pulses.     Heart sounds: Normal heart sounds.  Musculoskeletal:        General: Tenderness present. No swelling, deformity or signs of injury. Normal range of motion.     Comments: Phalen's test is positive  Neurological:     Mental Status: She is alert.     UC Treatments / Results  Labs (all labs ordered are listed, but only abnormal results are displayed) Labs Reviewed  CERVICOVAGINAL ANCILLARY ONLY    EKG   Radiology No results found.  Procedures Procedures (including critical care time)  Medications Ordered in UC Medications - No data to display  Initial Impression / Assessment and Plan / UC Course  I have reviewed the triage vital signs and the nursing notes.  Pertinent labs & imaging results that were available during my care of the patient were reviewed by me and considered in my medical decision making (see chart for details).     1.  Right carpal tunnel syndrome, Prednisone 40 mg orally daily for 5 days Wrist brace Gentle range of motion exercises Icing of the left wrist and hand after work If symptoms persist please return to urgent care to be reevaluated  2.  Screen for STDs: Cervical vaginal swab for GC/chlamydia/trichomonas  Final Clinical Impressions(s) / UC Diagnoses   Final diagnoses:  Right carpal tunnel syndrome  Screen for STD (sexually transmitted disease)     Discharge Instructions      Please wear your wrist splint during sleep Take medications as prescribed Icing of the right wrist after work If symptoms worsen please return to urgent care to be reevaluated We will  call you with recommendations if labs abnormal Please do not drive or operate heavy machinery after taking muscle relaxants.  Muscle relaxants makes you drowsy.   ED  Prescriptions     Medication Sig Dispense Auth. Provider   predniSONE (DELTASONE) 20 MG tablet Take 2 tablets (40 mg total) by mouth daily for 5 days. 10 tablet Ameia Morency, Myrene Galas, MD   methocarbamol (ROBAXIN) 500 MG tablet Take 1 tablet (500 mg total) by mouth at bedtime as needed for muscle spasms. 10 tablet Shakeia Krus, Myrene Galas, MD      PDMP not reviewed this encounter.   Chase Picket, MD 06/02/21 1233    Chase Picket, MD 06/02/21 [phone]

## 2021-06-05 ENCOUNTER — Encounter: Payer: Managed Care, Other (non HMO) | Admitting: Student

## 2021-06-05 ENCOUNTER — Other Ambulatory Visit: Payer: Self-pay

## 2021-06-06 ENCOUNTER — Telehealth (HOSPITAL_COMMUNITY): Payer: Self-pay | Admitting: Emergency Medicine

## 2021-06-06 ENCOUNTER — Other Ambulatory Visit: Payer: Self-pay

## 2021-06-06 MED ORDER — FLUCONAZOLE 150 MG PO TABS
150.0000 mg | ORAL_TABLET | Freq: Once | ORAL | 0 refills | Status: AC
  Filled 2021-06-06: qty 2, 4d supply, fill #0
  Filled 2021-06-08: qty 2, 2d supply, fill #0

## 2021-06-06 MED ORDER — METRONIDAZOLE 500 MG PO TABS
500.0000 mg | ORAL_TABLET | Freq: Two times a day (BID) | ORAL | 0 refills | Status: DC
  Filled 2021-06-06 – 2021-11-08 (×3): qty 14, 7d supply, fill #0

## 2021-06-07 ENCOUNTER — Encounter (HOSPITAL_COMMUNITY): Payer: Self-pay

## 2021-06-07 ENCOUNTER — Ambulatory Visit (HOSPITAL_COMMUNITY)
Admission: RE | Admit: 2021-06-07 | Discharge: 2021-06-07 | Disposition: A | Discharge status: Home / Self Care | Payer: Managed Care, Other (non HMO) | Source: Ambulatory Visit | Attending: Physician Assistant | Admitting: Physician Assistant

## 2021-06-07 ENCOUNTER — Other Ambulatory Visit: Payer: Self-pay

## 2021-06-07 VITALS — BP 142/89 | HR 77 | Temp 98.8°F | Resp 17 | Ht 64.0 in | Wt 250.0 lb

## 2021-06-07 DIAGNOSIS — R197 Diarrhea, unspecified: Secondary | ICD-10-CM

## 2021-06-07 DIAGNOSIS — R112 Nausea with vomiting, unspecified: Secondary | ICD-10-CM

## 2021-06-07 DIAGNOSIS — R1084 Generalized abdominal pain: Secondary | ICD-10-CM | POA: Diagnosis not present

## 2021-06-07 LAB — POCT URINALYSIS DIPSTICK, ED / UC
Bilirubin Urine: NEGATIVE
Glucose, UA: NEGATIVE mg/dL
Hgb urine dipstick: NEGATIVE
Ketones, ur: 15 mg/dL — AB
Leukocytes,Ua: NEGATIVE
Nitrite: NEGATIVE
Protein, ur: 30 mg/dL — AB
Specific Gravity, Urine: 1.02 (ref 1.005–1.030)
Urobilinogen, UA: 1 mg/dL (ref 0.0–1.0)
pH: 7 (ref 5.0–8.0)

## 2021-06-07 MED ORDER — ONDANSETRON 4 MG PO TBDP
4.0000 mg | ORAL_TABLET | Freq: Three times a day (TID) | ORAL | 0 refills | Status: DC | PRN
  Filled 2021-06-07: qty 20, 7d supply, fill #0

## 2021-06-07 MED ORDER — ONDANSETRON 4 MG PO TBDP
4.0000 mg | ORAL_TABLET | Freq: Once | ORAL | Status: AC
  Administered 2021-06-07: 4 mg via ORAL

## 2021-06-07 MED ORDER — ONDANSETRON 4 MG PO TBDP
ORAL_TABLET | ORAL | Status: AC
  Filled 2021-06-07: qty 1

## 2021-06-07 MED ORDER — ONDANSETRON 4 MG PO TBDP: 4.0000 mg | ORAL_TABLET | Freq: Three times a day (TID) | ORAL | 0 refills | Status: DC | PRN

## 2021-06-07 NOTE — ED Provider Notes (Signed)
[facility]    CSN: [id number] Arrival date & time: 06/07/21  1652      History   Chief Complaint Chief Complaint  Patient presents with   Abdominal Pain    HPI [name] is a 51 y.o. female.   Patient presents today with a 2-day history of GI symptoms.  She reports abdominal pain, nausea, vomiting, diarrhea.  Abdominal pain is rated 7 on a 0-10 pain scale, generalized throughout abdomen, described as cramping, no aggravating relieving factors identified.  She has taken Pepto-Bismol with temporary improvement of symptoms.  Reports that nausea/vomiting symptoms have improved and she has not been throwing up today.  She has been able to eat and drink despite symptoms.  She denies any fever or URI symptoms including cough, congestion, fever, chest pain, shortness of breath.  She denies history of gastrointestinal disorder.  Does report sick contacts with similar symptoms.  Reports eating a seafood boil prior to symptom onset.  Denies additional suspicious food intake, recent antibiotics, medication changes, recent travel.  She has had a tubal ligation but denies additional abdominal surgeries.   Past Medical History:  Diagnosis Date   Anxiety    Asthma    Hypertension    Insomnia    Muscle spasm    Prediabetes 2016   Hgb A1c of 6.0 in 2016   Speech impediment     Patient Active Problem List   Diagnosis Date Noted   Worried well 07/16/2018   Anxiety state 05/21/2014   Muscle spasm 05/21/2014   Family history of diabetes mellitus (DM) 05/21/2014    Past Surgical History:  Procedure Laterality Date   TUBAL LIGATION      OB History   No obstetric history on file.      Home Medications    Prior to Admission medications   Medication Sig Start Date End Date Taking? Authorizing Provider  fluconazole (DIFLUCAN) 150 MG tablet Take 1 tablet (150 mg total) by mouth once for 1 dose. Repeat in 3 days if symptoms persist 06/06/21 06/07/21  Merrilee Jansky,  MD  hydrocortisone (ANUSOL-HC) 2.5 % rectal cream Place 1 application rectally 2 (two) times daily. 10/06/20   Waldon Merl, PA-C  methocarbamol (ROBAXIN) 500 MG tablet Take 1 tablet (500 mg total) by mouth at bedtime as needed for muscle spasms. 06/01/21   Lamptey, Britta Mccreedy, MD  metroNIDAZOLE (FLAGYL) 500 MG tablet Take 1 tablet (500 mg total) by mouth 2 (two) times daily. 06/06/21   Lamptey, Britta Mccreedy, MD  ondansetron (ZOFRAN-ODT) 4 MG disintegrating tablet Take 1 tablet (4 mg total) by mouth every 8 (eight) hours as needed for nausea or vomiting. 06/07/21   Willmar Carmack, Denny Peon K, PA-C  predniSONE (DELTASONE) 20 MG tablet Take 2 tablets (40 mg total) by mouth daily for 5 days. 06/01/21 06/07/21  Merrilee Jansky, MD  PROVENTIL HFA 108 [phone] Base) MCG/ACT inhaler INHALE 1-2 PUFFS INTO THE LUNGS EVERY 6 (SIX) HOURS AS NEEDED FOR WHEEZING OR SHORTNESS OF BREATH. 01/02/19   Hoy Register, MD    Family History Family History  Problem Relation Age of Onset   Asthma Mother    Diabetes Father    Hypertension Father    Heart disease Father    Stroke Father    Stuttering Father    Hypertension Sister    Cancer Sister    Diabetes Maternal Grandmother    Cancer Maternal Grandmother    Hypertension Maternal Grandfather    Hypertension Paternal Actor  Mental illness Daughter     Social History Social History   Tobacco Use   Smoking status: Former   Smokeless tobacco: Never  Building services engineer Use: Never used  Substance Use Topics   Alcohol use: Yes    Alcohol/week: 0.0 standard drinks    Comment: Occasional   Drug use: No    Types: Marijuana     Allergies   Septra [sulfamethoxazole-trimethoprim] and Sulfa antibiotics   Review of Systems Review of Systems  Constitutional:  Positive for activity change. Negative for appetite change, fatigue and fever.  Respiratory:  Negative for cough and shortness of breath.   Cardiovascular:  Negative for chest pain.  Gastrointestinal:   Positive for abdominal pain, diarrhea, nausea and vomiting.  Musculoskeletal:  Negative for arthralgias and myalgias.  Neurological:  Negative for dizziness, light-headedness and headaches.    Physical Exam Triage Vital Signs ED Triage Vitals  Enc Vitals Group     BP 06/07/21 1719 (!) 153/99     Pulse Rate 06/07/21 1719 77     Resp 06/07/21 1719 17     Temp 06/07/21 1719 98.8 F (37.1 C)     Temp Source 06/07/21 1719 Oral     SpO2 06/07/21 1719 96 %     Weight 06/07/21 1718 250 lb (113.4 kg)     Height 06/07/21 1718 5\' 4"  (1.626 m)     Head Circumference --      Peak Flow --      Pain Score 06/07/21 1718 7     Pain Loc --      Pain Edu? --      Excl. in GC? --    No data found.  Updated Vital Signs BP (!) 142/89 (BP Location: Right Arm)    Pulse 77    Temp 98.8 F (37.1 C) (Oral)    Resp 17    Ht 5\' 4"  (1.626 m)    Wt 250 lb (113.4 kg)    SpO2 100%    BMI 42.91 kg/m   Visual Acuity Right Eye Distance:   Left Eye Distance:   Bilateral Distance:    Right Eye Near:   Left Eye Near:    Bilateral Near:     Physical Exam Vitals reviewed.  Constitutional:      General: She is awake. She is not in acute distress.    Appearance: Normal appearance. She is well-developed. She is not ill-appearing.     Comments: Very pleasant female stated age in no acute distress sitting comfortably in exam room  HENT:     Head: Normocephalic and atraumatic.     Mouth/Throat:     Mouth: Mucous membranes are moist.  Cardiovascular:     Rate and Rhythm: Normal rate and regular rhythm.     Heart sounds: Normal heart sounds, S1 normal and S2 normal. No murmur heard. Pulmonary:     Effort: Pulmonary effort is normal.     Breath sounds: Normal breath sounds. No wheezing, rhonchi or rales.     Comments: Clear to auscultation bilaterally Abdominal:     General: Bowel sounds are normal.     Palpations: Abdomen is soft.     Tenderness: There is abdominal tenderness in the right lower quadrant,  suprapubic area and left lower quadrant. There is no right CVA tenderness, left CVA tenderness, guarding or rebound.     Comments: Mild tenderness palpation in lower abdomen.  No evidence of acute abdomen on physical exam.  Psychiatric:  Behavior: Behavior is cooperative.     UC Treatments / Results  Labs (all labs ordered are listed, but only abnormal results are displayed) Labs Reviewed  POCT URINALYSIS DIPSTICK, ED / UC - Abnormal; Notable for the following components:      Result Value   Ketones, ur 15 (*)    Protein, ur 30 (*)    All other components within normal limits    EKG   Radiology No results found.  Procedures Procedures (including critical care time)  Medications Ordered in UC Medications  ondansetron (ZOFRAN-ODT) disintegrating tablet 4 mg (4 mg Oral Given 06/07/21 1817)    Initial Impression / Assessment and Plan / UC Course  I have reviewed the triage vital signs and the nursing notes.  Pertinent labs & imaging results that were available during my care of the patient were reviewed by me and considered in my medical decision making (see chart for details).     Vital signs and physical exam reassuring today; no indication for emergent evaluation or imaging.  UA obtained showed trace ketones without significant dehydration or evidence of infection.  Offered lab work including CBC and CMP but patient declined this today.  She was given Zofran in clinic with improvement of nausea symptoms and able to pass oral challenge by eating graham crackers and drinking water.  She was sent home with Zofran to be used on a scheduled basis for the next several days with recommendation to eat a bland diet and avoid spicy/acidic/fatty foods.  She is to push fluids.  Discussed that if symptoms are not improving within a few days she should return here or see her PCP.  Discussed alarm symptoms that warrant emergent evaluation including severe abdominal pain, nausea/vomiting  interfering with oral intake, melena, hematochezia, hematemesis.  Strict return precautions given to which she expressed understanding.  Work excuse note provided.  Final Clinical Impressions(s) / UC Diagnoses   Final diagnoses:  Generalized abdominal pain  Nausea vomiting and diarrhea     Discharge Instructions      Your urine showed that you had not been eating very much but did not show any significant dehydration.  Please continue taking Zofran on a scheduled basis for the next several days.  Eat a bland diet and drink plenty of fluid.  Avoid spicy/acidic/fatty foods.  If your symptoms are not improving within a few days please return for reevaluation.  If anything worsens and you develop severe abdominal pain, nausea/vomiting, blood in your stool, blood in your vomit you need to go to the emergency room.     ED Prescriptions     Medication Sig Dispense Auth. Provider   ondansetron (ZOFRAN-ODT) 4 MG disintegrating tablet  (Status: Discontinued) Take 1 tablet (4 mg total) by mouth every 8 (eight) hours as needed for nausea or vomiting. 20 tablet Makeya Hilgert K, PA-C   ondansetron (ZOFRAN-ODT) 4 MG disintegrating tablet Take 1 tablet (4 mg total) by mouth every 8 (eight) hours as needed for nausea or vomiting. 20 tablet Derrisha Foos, Noberto Retort, PA-C      PDMP not reviewed this encounter.   Jeani Hawking, PA-C 06/07/21 1832

## 2021-06-07 NOTE — ED Triage Notes (Signed)
Pt reports abdominal pain and n/v/d x 2 days.

## 2021-06-07 NOTE — Discharge Instructions (Signed)
Your urine showed that you had not been eating very much but did not show any significant dehydration.  Please continue taking Zofran on a scheduled basis for the next several days.  Eat a bland diet and drink plenty of fluid.  Avoid spicy/acidic/fatty foods.  If your symptoms are not improving within a few days please return for reevaluation.  If anything worsens and you develop severe abdominal pain, nausea/vomiting, blood in your stool, blood in your vomit you need to go to the emergency room.

## 2021-06-08 ENCOUNTER — Other Ambulatory Visit: Payer: Self-pay

## 2021-06-09 ENCOUNTER — Other Ambulatory Visit: Payer: Self-pay

## 2021-06-12 ENCOUNTER — Other Ambulatory Visit: Payer: Self-pay

## 2021-06-19 ENCOUNTER — Emergency Department (HOSPITAL_COMMUNITY)
Admission: EM | Admit: 2021-06-19 | Discharge: 2021-06-19 | Disposition: A | Discharge status: Home / Self Care | Payer: Commercial Managed Care - HMO | Attending: Emergency Medicine | Admitting: Emergency Medicine

## 2021-06-19 ENCOUNTER — Encounter (HOSPITAL_COMMUNITY): Payer: Self-pay | Admitting: Emergency Medicine

## 2021-06-19 ENCOUNTER — Emergency Department (HOSPITAL_COMMUNITY): Payer: Commercial Managed Care - HMO

## 2021-06-19 DIAGNOSIS — Z8616 Personal history of COVID-19: Secondary | ICD-10-CM | POA: Diagnosis not present

## 2021-06-19 DIAGNOSIS — Z7951 Long term (current) use of inhaled steroids: Secondary | ICD-10-CM | POA: Insufficient documentation

## 2021-06-19 DIAGNOSIS — Z20822 Contact with and (suspected) exposure to covid-19: Secondary | ICD-10-CM | POA: Diagnosis not present

## 2021-06-19 DIAGNOSIS — J209 Acute bronchitis, unspecified: Secondary | ICD-10-CM | POA: Diagnosis not present

## 2021-06-19 DIAGNOSIS — J45909 Unspecified asthma, uncomplicated: Secondary | ICD-10-CM | POA: Insufficient documentation

## 2021-06-19 DIAGNOSIS — R059 Cough, unspecified: Secondary | ICD-10-CM | POA: Diagnosis present

## 2021-06-19 LAB — RESP PANEL BY RT-PCR (FLU A&B, COVID) ARPGX2
Influenza A by PCR: NEGATIVE
Influenza B by PCR: NEGATIVE
SARS Coronavirus 2 by RT PCR: NEGATIVE

## 2021-06-19 MED ORDER — BENZONATATE 100 MG PO CAPS
100.0000 mg | ORAL_CAPSULE | Freq: Three times a day (TID) | ORAL | 0 refills | Status: DC
  Filled 2021-06-19: qty 21, 7d supply, fill #0

## 2021-06-19 MED ORDER — ALBUTEROL SULFATE HFA 108 (90 BASE) MCG/ACT IN AERS
1.0000 | INHALATION_SPRAY | Freq: Four times a day (QID) | RESPIRATORY_TRACT | 0 refills | Status: DC | PRN
  Filled 2021-06-19: qty 8.5, 25d supply, fill #0

## 2021-06-19 NOTE — ED Provider Triage Note (Signed)
Emergency Medicine Provider Triage Evaluation Note  Rebecca Stevenson , a 51 y.o. female  was evaluated in triage.  Pt complains of cough and shortness of breath for the last few days. Patient tested positive for COVID last week. No fever but does report associated chills.   Review of Systems  Positive:  Negative: See above   Physical Exam  BP (!) 150/85 (BP Location: Right Arm)    Pulse 87    Temp 98.7 F (37.1 C) (Oral)    Resp 14    SpO2 100%  Gen:   Awake, no distress   Resp:  Normal effort  MSK:   Moves extremities without difficulty  Other:    Medical Decision Making  Medically screening exam initiated at 2:10 PM.  Appropriate orders placed.  Rebecca Stevenson was informed that the remainder of the evaluation will be completed by another provider, this initial triage assessment does not replace that evaluation, and the importance of remaining in the ED until their evaluation is complete.     Honor Loh [place], [place] 06/19/21 [phone]

## 2021-06-19 NOTE — ED Triage Notes (Signed)
Patient here with complaint of cough that started five days ago. Afebrile in triage, hoarse voice started two days ago. Patient also complains of shortness of breath. Patient alert, oriented, speaking in complete sentences, and in no apparent distress at this time.

## 2021-06-19 NOTE — Discharge Instructions (Addendum)
You were negative for COVID and flu today.  Your chest x-ray was also without signs of pneumonia.  Since you just had a course of steroids approximately 2 weeks ago, we will forego that today.  Instead I will send you home with an inhaler that you can use as needed if you feel short of breath or your cough seems to be really irritating you.  I have also sent you in a medication that often helps with cough and should hopefully help you sleep a little better at night.  Please note that since you tested positive for COVID 1.5 weeks ago, sometimes these postviral cough can linger up to a few weeks.  Try sleeping with a humidifier in your bed at night as dry air can irritate your airways anymore.  If you have sudden worsening shortness of breath or fevers, please return to the ED for evaluation.

## 2021-06-19 NOTE — ED Provider Notes (Signed)
[facility] EMERGENCY DEPARTMENT Provider Note   CSN: [id number] Arrival date & time: 06/19/21  1333     History  Chief Complaint  Stevenson presents with   Cough    ARHIANA BOBIER is a 51 y.o. female with history of asthma who presents to the ED for evaluation of cough and shortness of breath for approximately 1 week.  Per Stevenson, Rebecca Stevenson test 1.5 weeks ago.  Rebecca states most of her symptoms of resolved except for this lingering dry cough.  Rebecca currently does not use an inhaler at home for her asthma.  Rebecca has been taking OTC cold and flu medications without significant relief.  Stevenson was seen in urgent care approximately 2 weeks ago for complaints of carpal tunnel and was prescribed a course of prednisone.  Stevenson denies fever, chills, abdominal pain, nausea, vomiting and diarrhea.  Rebecca denies urinary symptoms.  Cough     Home Medications Prior to Admission medications   Medication Sig Start Date End Date Taking? Authorizing Provider  albuterol (VENTOLIN HFA) 108 (90 Base) MCG/ACT inhaler Inhale 1-2 puffs into the lungs every 6 (six) hours as needed for wheezing or shortness of breath. 06/19/21  Yes Raynald Blend R, PA-C  benzonatate (TESSALON) 100 MG capsule Take 1 capsule (100 mg total) by mouth every 8 (eight) hours. 06/19/21  Yes Janell Quiet, PA-C  hydrocortisone (ANUSOL-HC) 2.5 % rectal cream Place 1 application rectally 2 (two) times daily. 10/06/20   Waldon Merl, PA-C  methocarbamol (ROBAXIN) 500 MG tablet Take 1 tablet (500 mg total) by mouth at bedtime as needed for muscle spasms. 06/01/21   Lamptey, Britta Mccreedy, MD  metroNIDAZOLE (FLAGYL) 500 MG tablet Take 1 tablet (500 mg total) by mouth 2 (two) times daily. 06/06/21   Lamptey, Britta Mccreedy, MD  ondansetron (ZOFRAN-ODT) 4 MG disintegrating tablet Take 1 tablet (4 mg total) by mouth every 8 (eight) hours as needed for nausea or vomiting. 06/07/21   Raspet, Erin K, PA-C  PROVENTIL  HFA 108 (90 Base) MCG/ACT inhaler INHALE 1-2 PUFFS INTO THE LUNGS EVERY 6 (SIX) HOURS AS NEEDED FOR WHEEZING OR SHORTNESS OF BREATH. 01/02/19   Hoy Register, MD      Allergies    Septra [sulfamethoxazole-trimethoprim] and Sulfa antibiotics    Review of Systems   Review of Systems  Respiratory:  Positive for cough.    Physical Exam Updated Vital Signs BP 132/75    Pulse 81    Temp 98.7 F (37.1 C) (Oral)    Resp (!) 22    LMP 06/05/2021 Comment: tubal ligation   SpO2 95%  Physical Exam Vitals and nursing note reviewed.  Constitutional:      General: Rebecca is not in acute distress.    Appearance: Rebecca is not ill-appearing.  HENT:     Head: Atraumatic.  Eyes:     Conjunctiva/sclera: Conjunctivae normal.  Cardiovascular:     Rate and Rhythm: Normal rate and regular rhythm.     Pulses: Normal pulses.     Heart sounds: No murmur heard. Pulmonary:     Effort: Pulmonary effort is normal. No respiratory distress.     Breath sounds: Normal breath sounds.     Comments: No wheezing bilaterally Abdominal:     General: Abdomen is flat. There is no distension.     Palpations: Abdomen is soft.     Tenderness: There is no abdominal tenderness.  Musculoskeletal:  General: Normal range of motion.     Cervical back: Normal range of motion.  Skin:    General: Skin is warm and dry.     Capillary Refill: Capillary refill takes less than 2 seconds.  Neurological:     General: No focal deficit present.     Mental Status: Rebecca is alert.  Psychiatric:        Mood and Affect: Mood normal.    ED Results / Procedures / Treatments   Labs (all labs ordered are listed, but only abnormal results are displayed) Labs Reviewed  RESP PANEL BY RT-PCR (FLU A&B, Stevenson) [id number]    EKG None  Radiology DG Chest 2 View  Result Date: 06/19/2021 CLINICAL DATA:  History of Stevenson infection 1.5 weeks ago. Persistent cough with shortness of breath and wheezing. EXAM: CHEST - 2 VIEW COMPARISON:   Radiographs 03/25/2019 and 08/28/2008. FINDINGS: The heart size and mediastinal contours are normal. The lungs are clear. There is no pleural effusion or pneumothorax. No acute osseous findings are identified. IMPRESSION: Stable chest.  No active cardiopulmonary process. Electronically Signed   By: Carey Bullocks M.D.   On: 06/19/2021 15:00    Procedures Procedures   Medications Ordered in ED Medications - No data to display  ED Course/ Medical Decision Making/ A&P                           Medical Decision Making Risk Prescription drug management.   History:  Per HPI Social determinants of health: no PCP  Initial impression:  This Stevenson presents to the ED for concern of cough, this involves an extensive number of treatment options, and is a complaint that carries with it a high risk of complications and morbidity.   Differential diagnosis for emergent cause of cough includes but is not limited to upper respiratory infection, lower respiratory infection, allergies, asthma, irritants, foreign body, medications such as ACE inhibitors, reflux, asthma, CHF, lung cancer, interstitial lung disease, psychiatric causes, postnasal drip and postinfectious bronchospasm. This is an overall well-appearing 51 year old female in no acute distress, nontoxic-appearing her vitals are stable.  Low concern for PE given her lack of tachycardia, hemoptysis.  Oxygen has been 95% plus on room air.  Pending respiratory panel and chest x-ray from triage.   Lab Tests and EKG:  I Ordered, reviewed, and interpreted labs and EKG.  The pertinent results include:  Respiratory panel negative   Imaging Studies ordered:  I ordered imaging studies including  Chest x-ray negative I independently visualized and interpreted imaging and I agree with the radiologist interpretation.    Disposition:  After consideration of the diagnostic results, physical exam, history and the patients response to treatment feel  that the patent would benefit from discharge with strict return precautions.   Viral bronchitis: Given Stevenson's symptoms and positive home Stevenson test, Rebecca is likely suffering from a postviral bronchitis.  Given that Rebecca has recently had a course of prednisone, will hold off prescribing this unless symptoms do not resolve with inhaler and Tessalon Perles.  Return precautions were discussed.  All questions were asked and answered and Stevenson was discharged home in good condition.  Final Clinical Impression(s) / ED Diagnoses Final diagnoses:  Acute bronchitis, unspecified organism    Rx / DC Orders ED Discharge Orders          Ordered    benzonatate (TESSALON) 100 MG capsule  Every 8 hours  06/19/21 1840    albuterol (VENTOLIN HFA) 108 (90 Base) MCG/ACT inhaler  Every 6 hours PRN        06/19/21 1840              Janell Quiet, [place] 06/19/21 1846    Sloan Leiter, DO 06/19/21 2254

## 2021-06-20 ENCOUNTER — Other Ambulatory Visit: Payer: Self-pay

## 2021-07-18 ENCOUNTER — Ambulatory Visit: Payer: Managed Care, Other (non HMO) | Admitting: Family Medicine

## 2021-09-13 ENCOUNTER — Ambulatory Visit
Admission: EM | Admit: 2021-09-13 | Discharge: 2021-09-13 | Disposition: A | Discharge status: Home / Self Care | Payer: Commercial Managed Care - HMO | Attending: Internal Medicine | Admitting: Internal Medicine

## 2021-09-13 DIAGNOSIS — M5441 Lumbago with sciatica, right side: Secondary | ICD-10-CM | POA: Diagnosis not present

## 2021-09-13 MED ORDER — PREDNISONE 20 MG PO TABS
40.0000 mg | ORAL_TABLET | Freq: Every day | ORAL | 0 refills | Status: AC
  Filled 2021-09-13 – 2021-11-08 (×3): qty 10, 5d supply, fill #0

## 2021-09-13 MED ORDER — CYCLOBENZAPRINE HCL 5 MG PO TABS
5.0000 mg | ORAL_TABLET | Freq: Two times a day (BID) | ORAL | 0 refills | Status: DC | PRN
  Filled 2021-09-13 – 2021-11-08 (×3): qty 20, 10d supply, fill #0

## 2021-09-13 NOTE — ED Provider Notes (Signed)
[facility]    CSN: [id number] Arrival date & time: 09/13/21  1826      History   Chief Complaint Chief Complaint  Patient presents with   Back Pain    HPI Rebecca SchneidersDanielle S Obi is a 51 y.o. female.   Patient presents with low back pain across the entirety of lower back.  This has been present for a few days.  Denies any obvious injury but patient reports that she has to bend over and walk under different machinery while at work and back pain started after she started this new job.  Denies any numbness or tingling.  Pain does radiate down right leg at times.  Denies any numbness or tingling.  Denies urinary frequency, dysuria, urinary or bowel continence, saddle anesthesia.  Patient has taken ibuprofen for symptoms with minimal improvement.   Back Pain  Past Medical History:  Diagnosis Date   Anxiety    Asthma    Hypertension    Insomnia    Muscle spasm    Prediabetes 2016   Hgb A1c of 6.0 in 2016   Speech impediment     Patient Active Problem List   Diagnosis Date Noted   Worried well 07/16/2018   Anxiety state 05/21/2014   Muscle spasm 05/21/2014   Family history of diabetes mellitus (DM) 05/21/2014    Past Surgical History:  Procedure Laterality Date   TUBAL LIGATION      OB History   No obstetric history on file.      Home Medications    Prior to Admission medications   Medication Sig Start Date End Date Taking? Authorizing Provider  cyclobenzaprine (FLEXERIL) 5 MG tablet Take 1 tablet (5 mg total) by mouth 2 (two) times daily as needed for muscle spasms. 09/13/21  Yes Arian Mcquitty, Rolly SalterHaley E, FNP  predniSONE (DELTASONE) 20 MG tablet Take 2 tablets (40 mg total) by mouth daily for 5 days. 09/13/21 09/18/21 Yes Tomy Khim, Acie FredricksonHaley E, FNP  albuterol (VENTOLIN HFA) 108 (90 Base) MCG/ACT inhaler Inhale 1-2 puffs into the lungs every 6 (six) hours as needed for wheezing or shortness of breath. 06/19/21   Janell Quietonklin, Erica R, PA-C  benzonatate (TESSALON) 100 MG capsule  Take 1 capsule (100 mg total) by mouth every 8 (eight) hours. 06/19/21   Janell Quietonklin, Erica R, PA-C  hydrocortisone (ANUSOL-HC) 2.5 % rectal cream Place 1 application rectally 2 (two) times daily. 10/06/20   Waldon MerlMartin, William C, PA-C  metroNIDAZOLE (FLAGYL) 500 MG tablet Take 1 tablet (500 mg total) by mouth 2 (two) times daily. 06/06/21   Lamptey, Britta MccreedyPhilip O, MD  ondansetron (ZOFRAN-ODT) 4 MG disintegrating tablet Take 1 tablet (4 mg total) by mouth every 8 (eight) hours as needed for nausea or vomiting. 06/07/21   Raspet, Erin K, PA-C  PROVENTIL HFA 108 (90 Base) MCG/ACT inhaler INHALE 1-2 PUFFS INTO THE LUNGS EVERY 6 (SIX) HOURS AS NEEDED FOR WHEEZING OR SHORTNESS OF BREATH. 01/02/19   Hoy RegisterNewlin, Enobong, MD    Family History Family History  Problem Relation Age of Onset   Asthma Mother    Diabetes Father    Hypertension Father    Heart disease Father    Stroke Father    Stuttering Father    Hypertension Sister    Cancer Sister    Diabetes Maternal Grandmother    Cancer Maternal Grandmother    Hypertension Maternal Grandfather    Hypertension Paternal Grandfather    Mental illness Daughter     Social History Social History  Tobacco Use   Smoking status: Former   Smokeless tobacco: Never  Vaping Use   Vaping Use: Never used  Substance Use Topics   Alcohol use: Yes    Alcohol/week: 0.0 standard drinks    Comment: Occasional   Drug use: No    Types: Marijuana     Allergies   Septra [sulfamethoxazole-trimethoprim] and Sulfa antibiotics   Review of Systems Review of Systems Per HPI  Physical Exam Triage Vital Signs ED Triage Vitals  Enc Vitals Group     BP 09/13/21 1832 129/84     Pulse Rate 09/13/21 1832 80     Resp 09/13/21 1832 18     Temp 09/13/21 1832 98.4 F (36.9 C)     Temp Source 09/13/21 1832 Oral     SpO2 09/13/21 1832 97 %     Weight --      Height --      Head Circumference --      Peak Flow --      Pain Score 09/13/21 1833 0     Pain Loc --      Pain  Edu? --      Excl. in GC? --    No data found.  Updated Vital Signs BP 129/84 (BP Location: Left Arm)   Pulse 80   Temp 98.4 F (36.9 C) (Oral)   Resp 18   SpO2 97%   Visual Acuity Right Eye Distance:   Left Eye Distance:   Bilateral Distance:    Right Eye Near:   Left Eye Near:    Bilateral Near:     Physical Exam Constitutional:      General: She is not in acute distress.    Appearance: Normal appearance. She is not toxic-appearing or diaphoretic.  HENT:     Head: Normocephalic and atraumatic.  Eyes:     Extraocular Movements: Extraocular movements intact.     Conjunctiva/sclera: Conjunctivae normal.  Pulmonary:     Effort: Pulmonary effort is normal.  Musculoskeletal:     Lumbar back: No swelling, deformity or tenderness. Positive right straight leg raise test. Negative left straight leg raise test.     Comments: Tenderness to palpation across the entirety of lower lumbar region.  No crepitus or step-off noted.  Neurological:     General: No focal deficit present.     Mental Status: She is alert and oriented to person, place, and time. Mental status is at baseline.     Deep Tendon Reflexes: Reflexes are normal and symmetric.  Psychiatric:        Mood and Affect: Mood normal.        Behavior: Behavior normal.        Thought Content: Thought content normal.        Judgment: Judgment normal.     UC Treatments / Results  Labs (all labs ordered are listed, but only abnormal results are displayed) Labs Reviewed - No data to display  EKG   Radiology No results found.  Procedures Procedures (including critical care time)  Medications Ordered in UC Medications - No data to display  Initial Impression / Assessment and Plan / UC Course  I have reviewed the triage vital signs and the nursing notes.  Pertinent labs & imaging results that were available during my care of the patient were reviewed by me and considered in my medical decision making (see chart  for details).     Differential diagnoses include lower lumbar strain versus low back pain  with sciatica.  Suspect patient's symptoms are related to strenuous activity at work and loss of bending and stooping.  Do not think imaging is necessary given no obvious injury.  Will treat with prednisone to decrease inflammation as symptoms have been refractory to over-the-counter NSAIDs.  Patient has taken prednisone in the past and tolerated well.  There does not appear to be any obvious contraindications as well.  Muscle relaxer also prescribed for patient.  Advised patient this can cause drowsiness.  Patient advised to not drive or take any other sedating medications with muscle relaxer.  Discussed return precautions.  Patient verbalized understanding and was agreeable with plan. Final Clinical Impressions(s) / UC Diagnoses   Final diagnoses:  Acute bilateral low back pain with right-sided sciatica     Discharge Instructions      It appears that you have low back pain with sciatica.  You have been prescribed 2 medications to help alleviate this. One of these medications is a muscle relaxer so be advised that it can cause drowsiness.  Do not take any other sedating medications with this medication.  Follow-up if symptoms persist or worsen.    ED Prescriptions     Medication Sig Dispense Auth. Provider   predniSONE (DELTASONE) 20 MG tablet Take 2 tablets (40 mg total) by mouth daily for 5 days. 10 tablet [place], Grant E, [place]   cyclobenzaprine (FLEXERIL) 5 MG tablet Take 1 tablet (5 mg total) by mouth 2 (two) times daily as needed for muscle spasms. 20 tablet [place], Acie Fredrickson, [place]      PDMP not reviewed this encounter.   Gustavus Bryant, [place] 09/13/21 [phone]

## 2021-09-13 NOTE — Discharge Instructions (Addendum)
It appears that you have low back pain with sciatica.  You have been prescribed 2 medications to help alleviate this. One of these medications is a muscle relaxer so be advised that it can cause drowsiness.  Do not take any other sedating medications with this medication.  Follow-up if symptoms persist or worsen.

## 2021-09-13 NOTE — ED Triage Notes (Signed)
Pt c/o lower back pain

## 2021-09-14 ENCOUNTER — Other Ambulatory Visit: Payer: Self-pay

## 2021-09-21 ENCOUNTER — Other Ambulatory Visit: Payer: Self-pay

## 2021-10-10 ENCOUNTER — Other Ambulatory Visit (HOSPITAL_COMMUNITY): Payer: Self-pay

## 2021-10-10 ENCOUNTER — Other Ambulatory Visit: Payer: Self-pay

## 2021-10-16 ENCOUNTER — Other Ambulatory Visit: Payer: Self-pay

## 2021-10-16 ENCOUNTER — Other Ambulatory Visit (HOSPITAL_BASED_OUTPATIENT_CLINIC_OR_DEPARTMENT_OTHER): Payer: Self-pay

## 2021-10-18 ENCOUNTER — Other Ambulatory Visit (HOSPITAL_BASED_OUTPATIENT_CLINIC_OR_DEPARTMENT_OTHER): Payer: Self-pay

## 2021-11-08 ENCOUNTER — Other Ambulatory Visit: Payer: Self-pay

## 2021-11-14 ENCOUNTER — Other Ambulatory Visit: Payer: Self-pay

## 2021-12-12 NOTE — Progress Notes (Signed)
Erroneous encounter-disregard

## 2021-12-21 ENCOUNTER — Encounter: Payer: Commercial Managed Care - HMO | Admitting: Family

## 2021-12-21 DIAGNOSIS — Z7689 Persons encountering health services in other specified circumstances: Secondary | ICD-10-CM

## 2022-03-29 ENCOUNTER — Other Ambulatory Visit: Payer: Self-pay

## 2022-03-29 ENCOUNTER — Encounter: Payer: Self-pay | Admitting: Emergency Medicine

## 2022-03-29 ENCOUNTER — Ambulatory Visit
Admission: EM | Admit: 2022-03-29 | Discharge: 2022-03-29 | Disposition: A | Discharge status: Home / Self Care | Payer: Commercial Managed Care - HMO | Attending: Internal Medicine | Admitting: Internal Medicine

## 2022-03-29 DIAGNOSIS — R030 Elevated blood-pressure reading, without diagnosis of hypertension: Secondary | ICD-10-CM

## 2022-03-29 DIAGNOSIS — R197 Diarrhea, unspecified: Secondary | ICD-10-CM

## 2022-03-29 DIAGNOSIS — A084 Viral intestinal infection, unspecified: Secondary | ICD-10-CM

## 2022-03-29 DIAGNOSIS — R112 Nausea with vomiting, unspecified: Secondary | ICD-10-CM | POA: Diagnosis not present

## 2022-03-29 MED ORDER — ONDANSETRON 4 MG PO TBDP: 4.0000 mg | ORAL_TABLET | Freq: Three times a day (TID) | ORAL | 0 refills | Status: DC | PRN

## 2022-03-29 NOTE — Discharge Instructions (Signed)
It appears that you have a viral illness that should run its course and self resolve with symptomatic treatment.  Ensure a bland diet.  See instructions.  Nausea medication has been prescribed.  Please ensure adequate fluid hydration with clear oral fluids that include Gatorade, water, Pedialyte.  Follow-up if symptoms persist or worsen.

## 2022-03-29 NOTE — ED Triage Notes (Signed)
Pt sts nausea and diarrhea x 3 days; pt sts needs note for work

## 2022-03-29 NOTE — ED Provider Notes (Signed)
[facility]    CSN: [id number] Arrival date & time: 03/29/22  1652      History   Chief Complaint Chief Complaint  Patient presents with   Diarrhea   Nausea    HPI Rebecca Stevenson is a 51 y.o. female.   Patient presents with nausea, vomiting, diarrhea that started about 3 days ago.  She reports symptoms have very much improved but that she needs a work note today.  She denies blood in stool or emesis.  Denies any abdominal pain, cough, upper respiratory symptoms, fever, body aches, chills.  Patient reports that she has has been able to tolerate food and fluids today.  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  Denies any recent unfavorable foods or travel outside [place].   Diarrhea   Past Medical History:  Diagnosis Date   Anxiety    Asthma    Hypertension    Insomnia    Muscle spasm    Prediabetes 2016   Hgb A1c of 6.0 in 2016   Speech impediment     Patient Active Problem List   Diagnosis Date Noted   Worried well 07/16/2018   Anxiety state 05/21/2014   Muscle spasm 05/21/2014   Family history of diabetes mellitus (DM) 05/21/2014    Past Surgical History:  Procedure Laterality Date   TUBAL LIGATION      OB History   No obstetric history on file.      Home Medications    Prior to Admission medications   Medication Sig Start Date End Date Taking? Authorizing Provider  ondansetron (ZOFRAN-ODT) 4 MG disintegrating tablet Take 1 tablet (4 mg total) by mouth every 8 (eight) hours as needed for nausea or vomiting. 03/29/22  Yes Tannie Koskela, Acie Fredrickson, FNP  albuterol (VENTOLIN HFA) 108 (90 Base) MCG/ACT inhaler Inhale 1-2 puffs into the lungs every 6 (six) hours as needed for wheezing or shortness of breath. 06/19/21   Janell Quiet, PA-C  benzonatate (TESSALON) 100 MG capsule Take 1 capsule (100 mg total) by mouth every 8 (eight) hours. 06/19/21   Janell Quiet, PA-C  cyclobenzaprine (FLEXERIL) 5 MG  tablet Take 1 tablet (5 mg total) by mouth 2 (two) times daily as needed for muscle spasms. 09/13/21   Gustavus Bryant, FNP  hydrocortisone (ANUSOL-HC) 2.5 % rectal cream Place 1 application rectally 2 (two) times daily. 10/06/20   Waldon Merl, PA-C  metroNIDAZOLE (FLAGYL) 500 MG tablet Take 1 tablet (500 mg total) by mouth 2 (two) times daily. Patient not taking: Reported on 03/29/2022 06/06/21   Merrilee Jansky, MD  PROVENTIL HFA 108 [phone] Base) MCG/ACT inhaler INHALE 1-2 PUFFS INTO THE LUNGS EVERY 6 (SIX) HOURS AS NEEDED FOR WHEEZING OR SHORTNESS OF BREATH. 01/02/19   Hoy Register, MD    Family History Family History  Problem Relation Age of Onset   Asthma Mother    Diabetes Father    Hypertension Father    Heart disease Father    Stroke Father    Stuttering Father    Hypertension Sister    Cancer Sister    Diabetes Maternal Grandmother    Cancer Maternal Grandmother    Hypertension Maternal Grandfather    Hypertension Paternal Grandfather    Mental illness Daughter     Social History Social History   Tobacco Use   Smoking status: Former   Smokeless tobacco: Never  Building services engineer Use: Never  used  Substance Use Topics   Alcohol use: Yes    Alcohol/week: 0.0 standard drinks of alcohol    Comment: Occasional   Drug use: No    Types: Marijuana     Allergies   Septra [sulfamethoxazole-trimethoprim] and Sulfa antibiotics   Review of Systems Review of Systems Per HPI  Physical Exam Triage Vital Signs ED Triage Vitals [03/29/22 1837]  Enc Vitals Group     BP (!) 175/98     Pulse Rate 84     Resp 18     Temp 98.2 F (36.8 C)     Temp Source Oral     SpO2 100 %     Weight      Height      Head Circumference      Peak Flow      Pain Score 2     Pain Loc      Pain Edu?      Excl. in GC?    No data found.  Updated Vital Signs BP (!) 154/84 (BP Location: Left Arm)   Pulse 84   Temp 98.2 F (36.8 C) (Oral)   Resp 18   SpO2 100%   Visual  Acuity Right Eye Distance:   Left Eye Distance:   Bilateral Distance:    Right Eye Near:   Left Eye Near:    Bilateral Near:     Physical Exam Constitutional:      General: She is not in acute distress.    Appearance: Normal appearance. She is not toxic-appearing or diaphoretic.  HENT:     Head: Normocephalic and atraumatic.     Mouth/Throat:     Mouth: Mucous membranes are moist.     Pharynx: No posterior oropharyngeal erythema.  Eyes:     Extraocular Movements: Extraocular movements intact.     Conjunctiva/sclera: Conjunctivae normal.  Cardiovascular:     Rate and Rhythm: Normal rate and regular rhythm.     Pulses: Normal pulses.     Heart sounds: Normal heart sounds.  Pulmonary:     Effort: Pulmonary effort is normal. No respiratory distress.     Breath sounds: Normal breath sounds.  Abdominal:     General: Bowel sounds are normal. There is no distension.     Palpations: Abdomen is soft.     Tenderness: There is no abdominal tenderness.  Neurological:     General: No focal deficit present.     Mental Status: She is alert and oriented to person, place, and time. Mental status is at baseline.     Cranial Nerves: Cranial nerves 2-12 are intact.     Sensory: Sensation is intact.     Motor: Motor function is intact.     Coordination: Coordination is intact.     Gait: Gait is intact.  Psychiatric:        Mood and Affect: Mood normal.        Behavior: Behavior normal.        Thought Content: Thought content normal.        Judgment: Judgment normal.      UC Treatments / Results  Labs (all labs ordered are listed, but only abnormal results are displayed) Labs Reviewed - No data to display  EKG   Radiology No results found.  Procedures Procedures (including critical care time)  Medications Ordered in UC Medications - No data to display  Initial Impression / Assessment and Plan / UC Course  I have reviewed the triage vital  signs and the nursing  notes.  Pertinent labs & imaging results that were available during my care of the patient were reviewed by me and considered in my medical decision making (see chart for details).     Patient's symptoms and physical exam appear viral in nature.  No concern for acute abdomen or dehydration on exam so do not think that emergent evaluation is necessary.  Will prescribe ondansetron to take as needed for nausea.  Patient advised bland diet and to ensure adequate fluid hydration as well.  Patient has mildly elevated blood pressure with recheck being improved.  Patient is asymptomatic regarding blood pressure and neuro exam is normal so do not think that emergent evaluation is necessary.  Patient advised to monitor blood pressure at home and to follow-up if it remains elevated.  She was given strict return and ER precautions.  Patient reports that she has PCP appointment in approximately 2 weeks.  Patient verbalized understanding and was agreeable with plan. Final Clinical Impressions(s) / UC Diagnoses   Final diagnoses:  Viral gastroenteritis  Nausea vomiting and diarrhea  Elevated blood pressure reading     Discharge Instructions      It appears that you have a viral illness that should run its course and self resolve with symptomatic treatment.  Ensure a bland diet.  See instructions.  Nausea medication has been prescribed.  Please ensure adequate fluid hydration with clear oral fluids that include Gatorade, water, Pedialyte.  Follow-up if symptoms persist or worsen.    ED Prescriptions     Medication Sig Dispense Auth. Provider   ondansetron (ZOFRAN-ODT) 4 MG disintegrating tablet Take 1 tablet (4 mg total) by mouth every 8 (eight) hours as needed for nausea or vomiting. 20 tablet [place], Acie Fredrickson, [place]      PDMP not reviewed this encounter.   Gustavus Bryant, [place] 03/29/22 [phone]

## 2022-10-03 ENCOUNTER — Ambulatory Visit: Payer: Commercial Managed Care - HMO | Admitting: Family Medicine

## 2022-12-27 ENCOUNTER — Ambulatory Visit: Payer: Commercial Managed Care - HMO | Admitting: Family Medicine

## 2023-01-16 ENCOUNTER — Ambulatory Visit: Payer: Commercial Managed Care - HMO | Admitting: Family Medicine

## 2023-01-31 ENCOUNTER — Ambulatory Visit: Payer: Commercial Managed Care - HMO | Admitting: Family Medicine

## 2023-05-21 ENCOUNTER — Ambulatory Visit (INDEPENDENT_AMBULATORY_CARE_PROVIDER_SITE_OTHER): Payer: No Typology Code available for payment source

## 2023-05-21 ENCOUNTER — Other Ambulatory Visit: Payer: Self-pay

## 2023-05-21 ENCOUNTER — Ambulatory Visit
Admission: RE | Admit: 2023-05-21 | Discharge: 2023-05-21 | Disposition: A | Discharge status: Home / Self Care | Payer: No Typology Code available for payment source | Source: Ambulatory Visit | Attending: Physician Assistant | Admitting: Physician Assistant

## 2023-05-21 ENCOUNTER — Other Ambulatory Visit (HOSPITAL_COMMUNITY): Payer: Self-pay

## 2023-05-21 VITALS — BP 162/93 | HR 69 | Temp 98.0°F | Resp 19

## 2023-05-21 DIAGNOSIS — R051 Acute cough: Secondary | ICD-10-CM | POA: Diagnosis not present

## 2023-05-21 DIAGNOSIS — J069 Acute upper respiratory infection, unspecified: Secondary | ICD-10-CM | POA: Diagnosis not present

## 2023-05-21 MED ORDER — AMOXICILLIN-POT CLAVULANATE 875-125 MG PO TABS
1.0000 | ORAL_TABLET | Freq: Two times a day (BID) | ORAL | 0 refills | Status: DC
  Filled 2023-05-21: qty 14, 7d supply, fill #0

## 2023-05-21 MED ORDER — PREDNISONE 20 MG PO TABS
40.0000 mg | ORAL_TABLET | Freq: Every day | ORAL | 0 refills | Status: AC
  Filled 2023-05-21: qty 10, 5d supply, fill #0

## 2023-05-21 NOTE — ED Triage Notes (Signed)
Sx x 4 days  Hasn't been tested for the flu Hx of asthma No fever SOB Productive cough with green mucus

## 2023-05-21 NOTE — ED Provider Notes (Signed)
[facility]    CSN: [id number] Arrival date & time: 05/21/23  0858      History   Chief Complaint Chief Complaint  Patient presents with   Cough    HPI Rebecca Stevenson is a 53 y.o. female.   Patient here today for evaluation of cough, shortness of breath and congestion that started about 4 days ago.  She does have history of asthma and states she has had some shortness of breath.  She has not had any fever.  She denies any vomiting or diarrhea.  The history is provided by the patient.  Influenza Presenting symptoms: cough, shortness of breath and sore throat   Presenting symptoms: no diarrhea, no fever, no nausea and no vomiting   Associated symptoms: nasal congestion   Associated symptoms: no chills and no ear pain     Past Medical History:  Diagnosis Date   Anxiety    Asthma    Hypertension    Insomnia    Muscle spasm    Prediabetes 2016   Hgb A1c of 6.0 in 2016   Speech impediment     Patient Active Problem List   Diagnosis Date Noted   Worried well 07/16/2018   Anxiety state 05/21/2014   Muscle spasm 05/21/2014   Family history of diabetes mellitus (DM) 05/21/2014    Past Surgical History:  Procedure Laterality Date   TUBAL LIGATION      OB History   No obstetric history on file.      Home Medications    Prior to Admission medications   Medication Sig Start Date End Date Taking? Authorizing Provider  amoxicillin-clavulanate (AUGMENTIN) 875-125 MG tablet Take 1 tablet by mouth every 12 (twelve) hours. 05/21/23  Yes Tomi Bamberger, PA-C  predniSONE (DELTASONE) 20 MG tablet Take 2 tablets (40 mg total) by mouth daily with breakfast for 5 days. 05/21/23 05/26/23 Yes Tomi Bamberger, PA-C  PROVENTIL HFA 108 (90 Base) MCG/ACT inhaler INHALE 1-2 PUFFS INTO THE LUNGS EVERY 6 (SIX) HOURS AS NEEDED FOR WHEEZING OR SHORTNESS OF BREATH. 01/02/19  Yes Newlin, Enobong, MD  albuterol (VENTOLIN HFA) 108 (90 Base) MCG/ACT inhaler Inhale 1-2 puffs  into the lungs every 6 (six) hours as needed for wheezing or shortness of breath. 06/19/21   Janell Quiet, PA-C  benzonatate (TESSALON) 100 MG capsule Take 1 capsule (100 mg total) by mouth every 8 (eight) hours. 06/19/21   Janell Quiet, PA-C  cyclobenzaprine (FLEXERIL) 5 MG tablet Take 1 tablet (5 mg total) by mouth 2 (two) times daily as needed for muscle spasms. 09/13/21   Gustavus Bryant, FNP  hydrocortisone (ANUSOL-HC) 2.5 % rectal cream Place 1 application rectally 2 (two) times daily. 10/06/20   Waldon Merl, PA-C  ondansetron (ZOFRAN-ODT) 4 MG disintegrating tablet Take 1 tablet (4 mg total) by mouth every 8 (eight) hours as needed for nausea or vomiting. 03/29/22   Gustavus Bryant, FNP    Family History Family History  Problem Relation Age of Onset   Asthma Mother    Diabetes Father    Hypertension Father    Heart disease Father    Stroke Father    Stuttering Father    Hypertension Sister    Cancer Sister    Diabetes Maternal Grandmother    Cancer Maternal Grandmother    Hypertension Maternal Grandfather    Hypertension Paternal Grandfather    Mental illness Daughter     Social History Social History   Tobacco  Use   Smoking status: Former   Smokeless tobacco: Never  Vaping Use   Vaping status: Never Used  Substance Use Topics   Alcohol use: Yes    Alcohol/week: 0.0 standard drinks of alcohol    Comment: Occasional   Drug use: No    Types: Marijuana     Allergies   Septra [sulfamethoxazole-trimethoprim] and Sulfa antibiotics   Review of Systems Review of Systems  Constitutional:  Negative for chills and fever.  HENT:  Positive for congestion, sinus pressure and sore throat. Negative for ear pain.   Eyes:  Negative for discharge and redness.  Respiratory:  Positive for cough and shortness of breath. Negative for wheezing.   Gastrointestinal:  Negative for abdominal pain, diarrhea, nausea and vomiting.     Physical Exam Triage Vital Signs ED  Triage Vitals  Encounter Vitals Group     BP      Systolic BP Percentile      Diastolic BP Percentile      Pulse      Resp      Temp      Temp src      SpO2      Weight      Height      Head Circumference      Peak Flow      Pain Score      Pain Loc      Pain Education      Exclude from Growth Chart    No data found.  Updated Vital Signs BP (!) 162/93 (BP Location: Left Wrist)   Pulse 69   Temp 98 F (36.7 C) (Oral)   Resp 19   LMP 06/05/2021 Comment: tubal ligation  SpO2 98%   Visual Acuity Right Eye Distance:   Left Eye Distance:   Bilateral Distance:    Right Eye Near:   Left Eye Near:    Bilateral Near:     Physical Exam Vitals and nursing note reviewed.  Constitutional:      General: She is not in acute distress.    Appearance: Normal appearance. She is not ill-appearing.  HENT:     Head: Normocephalic and atraumatic.     Right Ear: Tympanic membrane normal.     Left Ear: Tympanic membrane normal.     Nose: Congestion present.     Mouth/Throat:     Mouth: Mucous membranes are moist.     Pharynx: No oropharyngeal exudate or posterior oropharyngeal erythema.  Eyes:     Conjunctiva/sclera: Conjunctivae normal.  Cardiovascular:     Rate and Rhythm: Normal rate and regular rhythm.     Heart sounds: Normal heart sounds. No murmur heard. Pulmonary:     Effort: Pulmonary effort is normal. No respiratory distress.     Breath sounds: Normal breath sounds. No wheezing, rhonchi or rales.     Comments: Deep breathing produces cough Skin:    General: Skin is warm and dry.  Neurological:     Mental Status: She is alert.  Psychiatric:        Mood and Affect: Mood normal.        Thought Content: Thought content normal.      UC Treatments / Results  Labs (all labs ordered are listed, but only abnormal results are displayed) Labs Reviewed - No data to display  EKG   Radiology DG Chest 2 View Result Date: 05/21/2023 CLINICAL DATA:  Cough. EXAM: CHEST  - 2 VIEW COMPARISON:  Chest radiograph dated  06/19/2021. FINDINGS: No focal consolidation, pleural effusion, or pneumothorax. The cardiac silhouette is within normal limits. No acute osseous pathology. IMPRESSION: No active cardiopulmonary disease. Electronically Signed   By: Elgie Collard M.D.   On: 05/21/2023 11:13    Procedures Procedures (including critical care time)  Medications Ordered in UC Medications - No data to display  Initial Impression / Assessment and Plan / UC Course  I have reviewed the triage vital signs and the nursing notes.  Pertinent labs & imaging results that were available during my care of the patient were reviewed by me and considered in my medical decision making (see chart for details).    Discussed likely viral etiology of symptoms however given history of asthma will treat to cover possible exacerbation with steroid burst and Augmentin.  Augmentin would also cover possible impending sinusitis.  COVID and flu screening negative.  Chest x-ray with no acute findings.  Advised follow-up if no gradual improvement with treatment or with any further concerns.  Final Clinical Impressions(s) / UC Diagnoses   Final diagnoses:  Acute cough  Acute upper respiratory infection   Discharge Instructions   None    ED Prescriptions     Medication Sig Dispense Auth. Provider   predniSONE (DELTASONE) 20 MG tablet Take 2 tablets (40 mg total) by mouth daily with breakfast for 5 days. 10 tablet Tomi Bamberger, PA-C   amoxicillin-clavulanate (AUGMENTIN) 875-125 MG tablet Take 1 tablet by mouth every 12 (twelve) hours. 14 tablet Tomi Bamberger, PA-C      PDMP not reviewed this encounter.   Tomi Bamberger, PA-C 05/21/23 1127

## 2023-05-23 ENCOUNTER — Other Ambulatory Visit: Payer: Self-pay

## 2023-05-24 ENCOUNTER — Other Ambulatory Visit: Payer: Self-pay

## 2023-06-20 ENCOUNTER — Other Ambulatory Visit: Payer: Self-pay

## 2023-06-20 ENCOUNTER — Telehealth: Payer: No Typology Code available for payment source | Admitting: Physician Assistant

## 2023-06-20 ENCOUNTER — Ambulatory Visit: Payer: Self-pay | Admitting: General Practice

## 2023-06-20 ENCOUNTER — Other Ambulatory Visit (HOSPITAL_COMMUNITY): Payer: Self-pay

## 2023-06-20 DIAGNOSIS — J4521 Mild intermittent asthma with (acute) exacerbation: Secondary | ICD-10-CM

## 2023-06-20 DIAGNOSIS — J019 Acute sinusitis, unspecified: Secondary | ICD-10-CM

## 2023-06-20 DIAGNOSIS — B9689 Other specified bacterial agents as the cause of diseases classified elsewhere: Secondary | ICD-10-CM | POA: Diagnosis not present

## 2023-06-20 DIAGNOSIS — M5432 Sciatica, left side: Secondary | ICD-10-CM

## 2023-06-20 MED ORDER — LEVOCETIRIZINE DIHYDROCHLORIDE 5 MG PO TABS
5.0000 mg | ORAL_TABLET | Freq: Every evening | ORAL | 0 refills | Status: DC
  Filled 2023-06-20: qty 30, 30d supply, fill #0

## 2023-06-20 MED ORDER — DOXYCYCLINE HYCLATE 100 MG PO TABS
100.0000 mg | ORAL_TABLET | Freq: Two times a day (BID) | ORAL | 0 refills | Status: DC
  Filled 2023-06-20: qty 20, 10d supply, fill #0

## 2023-06-20 MED ORDER — ALBUTEROL SULFATE HFA 108 (90 BASE) MCG/ACT IN AERS
2.0000 | INHALATION_SPRAY | Freq: Four times a day (QID) | RESPIRATORY_TRACT | 0 refills | Status: DC | PRN
  Filled 2023-06-20: qty 6.7, 25d supply, fill #0

## 2023-06-20 MED ORDER — PREDNISONE 20 MG PO TABS
40.0000 mg | ORAL_TABLET | Freq: Every day | ORAL | 0 refills | Status: DC
  Filled 2023-06-20: qty 10, 5d supply, fill #0

## 2023-06-20 MED ORDER — GUAIFENESIN ER 600 MG PO TB12
1200.0000 mg | ORAL_TABLET | Freq: Two times a day (BID) | ORAL | 0 refills | Status: AC | PRN
  Filled 2023-06-20: qty 100, 25d supply, fill #0

## 2023-06-20 NOTE — Patient Instructions (Signed)
 Rebecca Stevenson, thank you for joining Piedad Climes, PA-C for today's virtual visit.  While this provider is not your primary care provider (PCP), if your PCP is located in our provider database this encounter information will be shared with them immediately following your visit.   A [facility] MyChart account gives you access to today's visit and all your visits, tests, and labs performed at [facility] " click here if you don't have a [facility] MyChart account or go to [email or web address]  Consent: (Patient) Rebecca Stevenson provided verbal consent for this virtual visit at the beginning of the encounter.  Current Medications:  Current Outpatient Medications:    albuterol (VENTOLIN HFA) 108 (90 Base) MCG/ACT inhaler, Inhale 1-2 puffs into the lungs every 6 (six) hours as needed for wheezing or shortness of breath., Disp: 8.5 g, Rfl: 0   amoxicillin-clavulanate (AUGMENTIN) 875-125 MG tablet, Take 1 tablet by mouth every 12 (twelve) hours., Disp: 14 tablet, Rfl: 0   benzonatate (TESSALON) 100 MG capsule, Take 1 capsule (100 mg total) by mouth every 8 (eight) hours., Disp: 21 capsule, Rfl: 0   cyclobenzaprine (FLEXERIL) 5 MG tablet, Take 1 tablet (5 mg total) by mouth 2 (two) times daily as needed for muscle spasms., Disp: 20 tablet, Rfl: 0   hydrocortisone (ANUSOL-HC) 2.5 % rectal cream, Place 1 application rectally 2 (two) times daily., Disp: 30 g, Rfl: 0   ondansetron (ZOFRAN-ODT) 4 MG disintegrating tablet, Take 1 tablet (4 mg total) by mouth every 8 (eight) hours as needed for nausea or vomiting., Disp: 20 tablet, Rfl: 0   PROVENTIL HFA 108 (90 Base) MCG/ACT inhaler, INHALE 1-2 PUFFS INTO THE LUNGS EVERY 6 (SIX) HOURS AS NEEDED FOR WHEEZING OR SHORTNESS OF BREATH., Disp: 1 g, Rfl: 0   Medications ordered in this encounter:  No orders of the defined types were placed in this encounter.    *If you need refills on other medications prior to your next  appointment, please contact your pharmacy*  Follow-Up: Call back or seek an in-person evaluation if the symptoms worsen or if the condition fails to improve as anticipated.  [facility] [phone]  Other Instructions Please keep hydrated and rest.  Take all prescribed medications as directed. Ok to continue Mucinex and Xyzal as needed after the flare calms down. Make sure to start exercises below. Follow-up with your new PCP as scheduled.   Sciatica Rehab Ask your health care provider which exercises are safe for you. Do exercises exactly as told by your health care provider and adjust them as directed. It is normal to feel mild stretching, pulling, tightness, or discomfort as you do these exercises. Stop right away if you feel sudden pain or your pain gets worse. Do not begin these exercises until told by your health care provider. Stretching and range-of-motion exercises These exercises warm up your muscles and joints and improve the movement and flexibility of your hips and back. These exercises also help to relieve pain, numbness, and tingling. Sciatic nerve glide  Sit in a chair with your head facing down toward your chest. Place your hands behind your back. Let your shoulders slump forward. Slowly straighten one of your legs while you tilt your head back as if you are looking toward the ceiling. Only straighten your leg as far as you can without making your symptoms worse. Hold this position for __________ seconds. Slowly return your leg and head back to the starting position. Repeat with your other  leg. Repeat __________ times. Complete this exercise __________ times a day. Knee to chest with hip adduction and internal rotation  Lie on your back on a firm surface with both legs straight. Bend one of your knees and move it up toward your chest until you feel a gentle stretch in your lower back and buttock. Then, move your knee toward the shoulder that is on the  opposite side from your leg. This is hip adduction and internal rotation. Hold your leg in this position by holding on to the front of your knee. Hold this position for __________ seconds. Slowly return to the starting position. Repeat with your other leg. Repeat __________ times. Complete this exercise __________ times a day. Prone extension on elbows  Lie on your abdomen on a firm surface. A bed may be too soft for this exercise. Prop yourself up on your elbows. Use your arms to help lift your chest up until you feel a gentle stretch in your abdomen and your lower back. This will place some of your body weight on your elbows. If this is uncomfortable, try stacking pillows under your chest. Your hips should stay down, against the surface that you are lying on. Keep your hip and back muscles relaxed. Hold this position for __________ seconds. Slowly relax your upper body and return to the starting position. Repeat __________ times. Complete this exercise __________ times a day. Strengthening exercises These exercises build strength and endurance in your back. Endurance is the ability to use your muscles for a long time, even after they get tired. Pelvic tilt This exercise strengthens the muscles that lie deep in the abdomen. Lie on your back on a firm surface. Bend your knees and keep your feet flat on the surface. Tense your abdominal muscles. Tip your pelvis up toward the ceiling and flatten your lower back into the firm surface. To help with this exercise, you may place a small towel under your lower back and try to push your back into the towel. Hold this position for __________ seconds. Let your muscles relax completely before you repeat this exercise. Repeat __________ times. Complete this exercise __________ times a day. Alternating arm and leg raises  Get on your hands and knees on a firm surface. If you are on a hard floor, you may want to use padding, such as an exercise mat, to  cushion your knees. Line up your arms and legs. Your hands should be directly below your shoulders, and your knees should be directly below your hips. Lift your left leg behind you. At the same time, raise your right arm and straighten it in front of you. Do not lift your leg higher than your hip. Do not lift your arm higher than your shoulder. Keep your abdominal and back muscles tight. Keep your hips facing the ground. Do not arch your back. Keep your balance carefully, and do not hold your breath. Hold this position for __________ seconds. Slowly return to the starting position. Repeat with your right leg and your left arm. Repeat __________ times. Complete this exercise __________ times a day. Posture and body mechanics Good posture and healthy body mechanics can help to relieve stress in your body's tissues and joints. Body mechanics refers to the movements and positions of your body while you do your daily activities. Posture is part of body mechanics. Good posture means: Your spine is in its natural S-curve position (neutral). Your shoulders are pulled back slightly. Your head is not tipped forward. Follow  these guidelines to improve your posture and body mechanics in your everyday activities. Standing  When standing, keep your spine neutral and your feet about hip width apart. Keep a slight bend in your knees. Your ears, shoulders, and hips should line up. When you do a task in which you stand in one place for a long time, place one foot up on a stable object that is 2-4 inches (5-10 cm) high, such as a footstool. This helps keep your spine neutral. Sitting  When sitting, keep your spine neutral and keep your feet flat on the floor. Use a footrest, if necessary, and keep your thighs parallel to the floor. Avoid rounding your shoulders, and avoid tilting your head forward. When working at a desk or a computer, keep your desk at a height where your hands are slightly lower than your  elbows. Slide your chair under your desk so you are close enough to maintain good posture. When working at a computer, place your monitor at a height where you are looking straight ahead and you do not have to tilt your head forward or downward to look at the screen. Resting  When lying down and resting, avoid positions that are most painful for you. If you have pain with activities such as sitting, bending, stooping, or squatting, lie in a position in which your body does not bend very much. For example, avoid curling up on your side with your arms and knees near your chest (fetal position). If you have pain with activities such as standing for a long time or reaching with your arms, lie with your spine in a neutral position and bend your knees slightly. Try the following positions: Lying on your side with a pillow between your knees. Lying on your back with a pillow under your knees. Lifting  When lifting objects, keep your feet at least shoulder width apart and tighten your abdominal muscles. Bend your knees and hips and keep your spine neutral. It is important to lift using the strength of your legs, not your back. Do not lock your knees straight out. Always ask for help to lift heavy or awkward objects. This information is not intended to replace advice given to you by your health care provider. Make sure you discuss any questions you have with your health care provider. Document Revised: 07/18/2021 Document Reviewed: 07/18/2021 Elsevier Patient Education  2024 Elsevier Inc.    If you have been instructed to have an in-person evaluation today at a local Urgent Care facility, please use the link below. It will take you to a list of all of our available [facility], including address, phone number and hours of operation. Please do not delay care.  [facility]  If you or a family member do not have a primary care provider, use the link below to schedule a visit and  establish care. When you choose a [facility] primary care physician or advanced practice provider, you gain a long-term partner in health. Find a Primary Care Provider  Learn more about [facility]'s in-office and virtual care options: [facility] Get Care Now

## 2023-06-20 NOTE — Telephone Encounter (Signed)
 Copied from CRM [id number]. Topic: Appointments - Appointment Scheduling >> Jun 20, 2023  8:22 AM Gery Pray wrote: Patient/patient representative is calling to schedule an appointment. Refer to attachments for appointment information. Patient is in pain on her left hip. Patient is having trouble with her COPD, Asthma, and Bronchitis . Patient states that symptoms are worse at night. Patient states symptoms are an 8.  Chief Complaint: cough Symptoms: wheezing Frequency: 1 week Pertinent Negatives: Patient denies dizziness, chest pain fever Disposition: [] ED /[] Urgent Care (no appt availability in office) / [] Appointment(In office/virtual)/ [x]  [facility]/ [] Home Care/ [] Refused Recommended Disposition /[] [facility]/ []  Follow-up with PCP Additional Notes: assisted pt with NP appt  Reason for Disposition  [1] MODERATE difficulty breathing (e.g., speaks in phrases, SOB even at rest, pulse 100-120) AND [2] NEW-onset or WORSE than normal  Answer Assessment - Initial Assessment Questions 1. RESPIRATORY STATUS: "Describe your breathing?" (e.g., wheezing, shortness of breath, unable to speak, severe coughing)      Severe coughing , wheezing 2. ONSET: "When did this breathing problem begin?"      1 week ago  3. PATTERN "Does the difficult breathing come and go, or has it been constant since it started?"      Constant  4. SEVERITY: "How bad is your breathing?" (e.g., mild, moderate, severe)    - MILD: No SOB at rest, mild SOB with walking, speaks normally in sentences, can lie down, no retractions, pulse < 100.    - MODERATE: SOB at rest, SOB with minimal exertion and prefers to sit, cannot lie down flat, speaks in phrases, mild retractions, audible wheezing, pulse 100-120.    - SEVERE: Very SOB at rest, speaks in single words, struggling to breathe, sitting hunched forward, retractions, pulse > 120      moderate 5. RECURRENT SYMPTOM: "Have you had difficulty breathing before?"  If Yes, ask: "When was the last time?" and "What happened that time?"      yes 6. CARDIAC HISTORY: "Do you have any history of heart disease?" (e.g., heart attack, angina, bypass surgery, angioplasty)      no 7. LUNG HISTORY: "Do you have any history of lung disease?"  (e.g., pulmonary embolus, asthma, emphysema)     Asthma, bronchitis 8. CAUSE: "What do you think is causing the breathing problem?"      unsure 9. OTHER SYMPTOMS: "Do you have any other symptoms? (e.g., dizziness, runny nose, cough, chest pain, fever)     Cough clear yellow  brown, fatigue, runny nose clear to yellow green  Protocols used: Breathing Difficulty-A-AH

## 2023-06-20 NOTE — Progress Notes (Signed)
 Virtual Visit Consent   Rebecca Stevenson, you are scheduled for a virtual visit with a Rebecca Stevenson provider today. Just as with appointments in the office, your consent must be obtained to participate. Your consent will be active for this visit and any virtual visit you may have with one of our providers in the next 365 days. If you have a MyChart account, a copy of this consent can be sent to you electronically.  As this is a virtual visit, video technology does not allow for your provider to perform a traditional examination. This may limit your provider's ability to fully assess your condition. If your provider identifies any concerns that need to be evaluated in person or the need to arrange testing (such as labs, EKG, etc.), we will make arrangements to do so. Although advances in technology are sophisticated, we cannot ensure that it will always work on either your end or our end. If the connection with a video visit is poor, the visit may have to be switched to a telephone visit. With either a video or telephone visit, we are not always able to ensure that we have a secure connection.  By engaging in this virtual visit, you consent to the provision of healthcare and authorize for your insurance to be billed (if applicable) for the services provided during this visit. Depending on your insurance coverage, you may receive a charge related to this service.  I need to obtain your verbal consent now. Are you willing to proceed with your visit today? Rebecca Stevenson has provided verbal consent on 06/20/2023 for a virtual visit (video or telephone). Rebecca Stevenson, [place]  Date: 06/20/2023 10:39 AM   Virtual Visit via Video Note   I, Rebecca Stevenson, connected with  Rebecca Stevenson  ([id number], [birth year]) on 06/20/23 at 10:30 AM EST by a video-enabled telemedicine application and verified that I am speaking with the correct person using two identifiers.  Location: Patient: Virtual  Visit Location Patient: Home Provider: Virtual Visit Location Provider: Home Office   I discussed the limitations of evaluation and management by telemedicine and the availability of in person appointments. The patient expressed understanding and agreed to proceed.    History of Present Illness: Rebecca Stevenson is a 53 y.o. who identifies as a female who was assigned female at birth, and is being seen today for multiple issues.   Last month had cold symptoms and was evaluated at urgent care. Had CXR that was negative. Was placed on Augmentin and cough medication. No treatment for asthma itself at that time. Notes initial cold symptoms improved but asthma has been flaring sporadically since then and then over the past couple of weeks more consistently. Notes chest tightness, wheezing. Past few days with more productive cough with change in sputum from clear to colored -- yellow and occasionally brown. Out of inhalers for some time. Had to borrow use of inhalers from family members.   Left low back with pain moving down into the leg into her foot. Occasional numbness/tingling in her left gluteus and hip.  Denies saddle anesthesia or any bowel/bladder habits. Denies trauma or injury.    HPI: HPI  Problems:  Patient Active Problem List   Diagnosis Date Noted   Worried well 07/16/2018   Anxiety state 05/21/2014   Muscle spasm 05/21/2014   Family history of diabetes mellitus (DM) 05/21/2014    Allergies:  Allergies  Allergen Reactions   Septra [Sulfamethoxazole-Trimethoprim] Itching    Itching, fatigue,  asthma flare up, tiredness, blood in urine.   Sulfa Antibiotics    Medications:  Current Outpatient Medications:    albuterol (VENTOLIN HFA) 108 (90 Base) MCG/ACT inhaler, Inhale 2 puffs into the lungs every 6 (six) hours as needed for wheezing or shortness of breath., Disp: 6.7 g, Rfl: 0   doxycycline (VIBRA-TABS) 100 MG tablet, Take 1 tablet (100 mg total) by mouth 2 (two) times  daily., Disp: 20 tablet, Rfl: 0   guaiFENesin (MUCINEX) 600 MG 12 hr tablet, Take 2 tablets (1,200 mg total) by mouth 2 (two) times daily as needed., Disp: 100 tablet, Rfl: 0   levocetirizine (XYZAL) 5 MG tablet, Take 1 tablet (5 mg total) by mouth every evening., Disp: 30 tablet, Rfl: 0   predniSONE (DELTASONE) 20 MG tablet, Take 2 tablets (40 mg total) by mouth daily with breakfast., Disp: 10 tablet, Rfl: 0   cyclobenzaprine (FLEXERIL) 5 MG tablet, Take 1 tablet (5 mg total) by mouth 2 (two) times daily as needed for muscle spasms., Disp: 20 tablet, Rfl: 0   ondansetron (ZOFRAN-ODT) 4 MG disintegrating tablet, Take 1 tablet (4 mg total) by mouth every 8 (eight) hours as needed for nausea or vomiting., Disp: 20 tablet, Rfl: 0  Observations/Objective: Patient is well-developed, well-nourished in no acute distress.  Resting comfortably at home.  Head is normocephalic, atraumatic.  No labored breathing. Speech is clear and coherent with logical content.  Patient is alert and oriented at baseline.   Assessment and Plan: 1. Mild intermittent asthma with exacerbation (Primary) - albuterol (VENTOLIN HFA) 108 (90 Base) MCG/ACT inhaler; Inhale 2 puffs into the lungs every 6 (six) hours as needed for wheezing or shortness of breath.  Dispense: 8 g; Refill: 0 - predniSONE (DELTASONE) 20 MG tablet; Take 2 tablets (40 mg total) by mouth daily with breakfast.  Dispense: 10 tablet; Refill: 0 - levocetirizine (XYZAL) 5 MG tablet; Take 1 tablet (5 mg total) by mouth every evening.  Dispense: 30 tablet; Refill: 0  History of asthma and questionable history of chronic bronchitis. Albuterol refilled.  Supportive measures and OTC medications reviewed. Prednisone and Xyzal per orders.  2. Acute bacterial sinusitis - doxycycline (VIBRA-TABS) 100 MG tablet; Take 1 tablet (100 mg total) by mouth 2 (two) times daily.  Dispense: 20 tablet; Refill: 0  Rx Doxycycline.  Increase fluids.  Rest.  Saline nasal spray.   Probiotic.  Mucinex as directed.  Humidifier in bedroom. Xyzal per orders.  Call or return to clinic if symptoms are not improving.  3. Sciatica, Left  Flare of recurring issues. No trauma or injury. The prednisone for her asthma flare should also help with this. Rehab exercise sent for her to start. Follow-up with new PCP as scheduled.     Follow Up Instructions: I discussed the assessment and treatment plan with the patient. The patient was provided an opportunity to ask questions and all were answered. The patient agreed with the plan and demonstrated an understanding of the instructions.  A copy of instructions were sent to the patient via MyChart unless otherwise noted below.   The patient was advised to call back or seek an in-person evaluation if the symptoms worsen or if the condition fails to improve as anticipated.    Rebecca Climes, PA-C

## 2023-09-03 ENCOUNTER — Encounter: Payer: Self-pay | Admitting: Family Medicine

## 2023-09-03 ENCOUNTER — Other Ambulatory Visit: Payer: Self-pay

## 2023-09-03 ENCOUNTER — Ambulatory Visit (INDEPENDENT_AMBULATORY_CARE_PROVIDER_SITE_OTHER): Payer: MEDICAID | Admitting: Family Medicine

## 2023-09-03 VITALS — BP 137/84 | HR 64 | Ht 63.0 in | Wt 248.6 lb

## 2023-09-03 DIAGNOSIS — F418 Other specified anxiety disorders: Secondary | ICD-10-CM | POA: Diagnosis not present

## 2023-09-03 DIAGNOSIS — G8929 Other chronic pain: Secondary | ICD-10-CM

## 2023-09-03 DIAGNOSIS — E669 Obesity, unspecified: Secondary | ICD-10-CM

## 2023-09-03 DIAGNOSIS — F419 Anxiety disorder, unspecified: Secondary | ICD-10-CM

## 2023-09-03 DIAGNOSIS — M5441 Lumbago with sciatica, right side: Secondary | ICD-10-CM | POA: Diagnosis not present

## 2023-09-03 DIAGNOSIS — E66813 Obesity, class 3: Secondary | ICD-10-CM

## 2023-09-03 DIAGNOSIS — B351 Tinea unguium: Secondary | ICD-10-CM

## 2023-09-03 DIAGNOSIS — Z131 Encounter for screening for diabetes mellitus: Secondary | ICD-10-CM

## 2023-09-03 DIAGNOSIS — Z6841 Body Mass Index (BMI) 40.0 and over, adult: Secondary | ICD-10-CM

## 2023-09-03 DIAGNOSIS — Z7689 Persons encountering health services in other specified circumstances: Secondary | ICD-10-CM

## 2023-09-03 MED ORDER — MIRTAZAPINE 15 MG PO TABS
15.0000 mg | ORAL_TABLET | Freq: Every day | ORAL | 1 refills | Status: DC
  Filled 2023-09-03 (×2): qty 30, 30d supply, fill #0

## 2023-09-03 NOTE — Progress Notes (Unsigned)
 New Patient Office Visit  Subjective    Patient ID: Rebecca Stevenson, female    DOB: May 26, 1970  Age: 53 y.o. MRN: [id number]  CC:  Chief Complaint  Patient presents with   New Patient (Initial Visit)    Has sciatica pain and is taking Magnlife for temporary  relief    Fungus on toenail   Lump on center of chest and forearm    Back Pain    Hurt back two weeks  while shopping, has been stretching but still having some      HPI [name] presents to establish care ***  Outpatient Encounter Medications as of 09/03/2023  Medication Sig   albuterol  (VENTOLIN  HFA) 108 (90 Base) MCG/ACT inhaler Inhale 2 puffs into the lungs every 6 (six) hours as needed for wheezing or shortness of breath.   cyclobenzaprine  (FLEXERIL ) 5 MG tablet Take 1 tablet (5 mg total) by mouth 2 (two) times daily as needed for muscle spasms.   doxycycline  (VIBRA -TABS) 100 MG tablet Take 1 tablet (100 mg total) by mouth 2 (two) times daily.   guaiFENesin  (MUCINEX ) 600 MG 12 hr tablet Take 2 tablets (1,200 mg total) by mouth 2 (two) times daily as needed.   levocetirizine (XYZAL ) 5 MG tablet Take 1 tablet (5 mg total) by mouth every evening.   ondansetron  (ZOFRAN -ODT) 4 MG disintegrating tablet Take 1 tablet (4 mg total) by mouth every 8 (eight) hours as needed for nausea or vomiting.   predniSONE  (DELTASONE ) 20 MG tablet Take 2 tablets (40 mg total) by mouth daily with breakfast.   No facility-administered encounter medications on file as of 09/03/2023.    Past Medical History:  Diagnosis Date   Anxiety    Asthma    Hypertension    Insomnia    Muscle spasm    Prediabetes 2016   Hgb A1c of 6.0 in 2016   Speech impediment     Past Surgical History:  Procedure Laterality Date   TUBAL LIGATION      Family History  Problem Relation Age of Onset   Asthma Mother    Diabetes Father    Hypertension Father    Heart disease Father    Stroke Father    Stuttering Father    Hypertension Sister     Cancer Sister    Diabetes Maternal Grandmother    Cancer Maternal Grandmother    Hypertension Maternal Grandfather    Hypertension Paternal Grandfather    Mental illness Daughter     Social History   Socioeconomic History   Marital status: Single    Spouse name: Not on file   Number of children: Not on file   Years of education: Not on file   Highest education level: Not on file  Occupational History   Not on file  Tobacco Use   Smoking status: Former   Smokeless tobacco: Never  Vaping Use   Vaping status: Never Used  Substance and Sexual Activity   Alcohol use: Yes    Alcohol/week: 0.0 standard drinks of alcohol    Comment: Occasional   Drug use: No    Types: Marijuana   Sexual activity: Yes    Birth control/protection: Condom  Other Topics Concern   Not on file  Social History Narrative   Lives with daughter and grandson in a two story home.  Does not work.  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  Education: some college.    Social Drivers of Health  Financial Resource Strain: Not on file  Food Insecurity: Not on file  Transportation Needs: Not on file  Physical Activity: Not on file  Stress: Not on file  Social Connections: Not on file  Intimate Partner Violence: Not on file    ROS      Objective   BP 137/84 (BP Location: Left Arm, Patient Position: Sitting, Cuff Size: Large)   Pulse 64   Ht 5\' 3"  (1.6 m)   Wt 248 lb 9.6 oz (112.8 kg)   LMP 06/05/2021 Comment: tubal ligation  SpO2 97%   BMI 44.04 kg/m   Physical Exam  {Labs (Optional):23779}    Assessment & Plan:   Screening for diabetes mellitus (DM)  Class 3 severe obesity due to excess calories without serious comorbidity with body mass index (BMI) of 40.0 to 44.9 in adult  Encounter to establish care  Chronic right-sided low back pain with right-sided sciatica     Return for physical.   Arlo Lama, MD

## 2023-09-04 ENCOUNTER — Encounter: Payer: Self-pay | Admitting: Family Medicine

## 2023-09-05 ENCOUNTER — Other Ambulatory Visit: Payer: Self-pay

## 2023-10-03 ENCOUNTER — Encounter: Admitting: Family Medicine

## 2023-10-09 ENCOUNTER — Encounter: Payer: Self-pay | Admitting: Family Medicine

## 2023-10-10 ENCOUNTER — Encounter: Admitting: Family Medicine

## 2023-10-10 ENCOUNTER — Telehealth: Payer: Self-pay | Admitting: Family Medicine

## 2023-10-10 NOTE — Telephone Encounter (Signed)
 Called patient to reschedule missed appointment no answer left voicemail

## 2023-10-31 ENCOUNTER — Ambulatory Visit: Payer: Self-pay

## 2023-10-31 NOTE — Telephone Encounter (Signed)
 FYI Only or Action Required?: FYI only for provider.  Patient was last seen in primary care on 09/03/2023 by Tanda Bleacher, MD.  Called Nurse Triage reporting Leg Swelling.  Symptoms began a week ago.  Interventions attempted: OTC medications: see note .  Symptoms are: gradually worsening.  Triage Disposition: See HCP Within 4 Hours (Or PCP Triage)  Patient/caregiver understands and will follow disposition?: Yes   Copied from CRM [id number]. Topic: Clinical - Red Word Triage >> Oct 31, 2023  4:15 PM Antwanette L wrote: Red Word that prompted transfer to Nurse Triage: Patient  is experiencing an severe pain, numbness, and swelling in right thigh Reason for Disposition  [1] SEVERE pain (e.g., excruciating, unable to do any normal activities) AND [2] not improved after 2 hours of pain medicine  Answer Assessment - Initial Assessment Questions 1. ONSET: When did the swelling start? (e.g., minutes, hours, days)     --------------- One week ago  2. LOCATION: What part of the leg is swollen?  Are both legs swollen or just one leg?     ------------- Left leg is numb:(Chronic - but MD not aware  ----------------- Right leg, warm to touch now  ( chronic- but MD not aware)   3. SEVERITY: How bad is the swelling? (e.g., localized; mild, moderate, severe)     -------------- Denies swelling    4. REDNESS: Is there redness or signs of infection?     -----------------Denies    5. PAIN: Is the swelling painful to touch? If Yes, ask: How painful is it?   (Scale 1-10; mild, moderate or severe)     -------------------------------- 8/10 ( Right side)- Intermittent ( only when she moves)   6. FEVER: Do you have a fever? If Yes, ask: What is it, how was it measured, and when did it start?      -=-------------- Denies    7. CAUSE: What do you think is causing the leg swelling?     --Sciatica    8. MEDICAL HISTORY: Do you have a history of blood clots (e.g., DVT), cancer,  heart failure, kidney disease, or liver failure?     ----------Denies    9. RECURRENT SYMPTOM: Have you had leg swelling before? If Yes, ask: When was the last time? What happened that time?     --------------------- Yes    10. OTHER SYMPTOMS: Do you have any other symptoms? (e.g., chest pain, difficulty breathing)       ---painful walking     Additional Information: Patient scheduled for local UC.   Appointment scheduled for patient appropriately.  Patient educated on pertinent s/s that would warrant emergent help/call 911/ ED/UC.  Patient verbalized understanding and agrees with plan . No additional questions/concerns noted during the time of the call.  Protocols used: Leg Swelling and Edema-A-AH, Leg Pain-A-AH

## 2023-11-01 ENCOUNTER — Ambulatory Visit: Payer: Self-pay

## 2023-12-05 ENCOUNTER — Other Ambulatory Visit: Payer: Self-pay | Admitting: Family Medicine

## 2023-12-05 ENCOUNTER — Encounter: Payer: Self-pay | Admitting: Family Medicine

## 2023-12-05 ENCOUNTER — Ambulatory Visit (INDEPENDENT_AMBULATORY_CARE_PROVIDER_SITE_OTHER): Admitting: Family Medicine

## 2023-12-05 VITALS — BP 131/85 | HR 66 | Ht 63.0 in | Wt 248.8 lb

## 2023-12-05 DIAGNOSIS — Z0000 Encounter for general adult medical examination without abnormal findings: Secondary | ICD-10-CM

## 2023-12-05 DIAGNOSIS — J4521 Mild intermittent asthma with (acute) exacerbation: Secondary | ICD-10-CM

## 2023-12-05 DIAGNOSIS — Z1211 Encounter for screening for malignant neoplasm of colon: Secondary | ICD-10-CM

## 2023-12-05 DIAGNOSIS — Z13228 Encounter for screening for other metabolic disorders: Secondary | ICD-10-CM

## 2023-12-05 DIAGNOSIS — Z13220 Encounter for screening for lipoid disorders: Secondary | ICD-10-CM | POA: Diagnosis not present

## 2023-12-05 DIAGNOSIS — Z1329 Encounter for screening for other suspected endocrine disorder: Secondary | ICD-10-CM | POA: Diagnosis not present

## 2023-12-05 DIAGNOSIS — Z1159 Encounter for screening for other viral diseases: Secondary | ICD-10-CM

## 2023-12-05 DIAGNOSIS — Z130 Encounter for screening for diseases of the blood and blood-forming organs and certain disorders involving the immune mechanism: Secondary | ICD-10-CM

## 2023-12-05 DIAGNOSIS — Z1231 Encounter for screening mammogram for malignant neoplasm of breast: Secondary | ICD-10-CM

## 2023-12-05 MED ORDER — ALBUTEROL SULFATE HFA 108 (90 BASE) MCG/ACT IN AERS: 2.0000 | INHALATION_SPRAY | Freq: Four times a day (QID) | RESPIRATORY_TRACT | 0 refills | Status: DC | PRN

## 2023-12-05 NOTE — Telephone Encounter (Unsigned)
 Copied from CRM #[id number]. Topic: Clinical - Prescription Issue >> Dec 05, 2023  4:48 PM Wess RAMAN wrote: Reason for CRM: Patient states albuterol  (VENTOLIN  HFA) 108 (90 Base) MCG/ACT inhaler was sent to the wrong pharmacy and needs to go to [facility] instead  [place] #: [phone]  Pharmacy: [facility] - [facility] [address] [place] [address] Phone: [phone] Fax: [phone] Hours: M-F 7:30a-7:00p

## 2023-12-05 NOTE — Telephone Encounter (Signed)
 Copied from CRM #[id number]. Topic: Clinical - Medication Refill >> Dec 05, 2023  4:46 PM Wess RAMAN wrote: Medication: levocetirizine (XYZAL ) 5 MG tablet  mirtazapine  (REMERON ) 15 MG tablet  Patient accidentally stated she was no longer taking mirtazapine . She did not know what it was by name.  Has the patient contacted their pharmacy? No (Agent: If no, request that the patient contact the pharmacy for the refill. If patient does not wish to contact the pharmacy document the reason why and proceed with request.) (Agent: If yes, when and what did the pharmacy advise?)  This is the patient's preferred pharmacy:  [facility] - [facility] [address] [place] [address] Phone: [phone] Fax: [phone]  Is this the correct pharmacy for this prescription? Yes If no, delete pharmacy and type the correct one.   Has the prescription been filled recently? Yes  Is the patient out of the medication? Yes  Has the patient been seen for an appointment in the last year OR does the patient have an upcoming appointment? Yes  Can we respond through MyChart? Yes  Agent: Please be advised that Rx refills may take up to 3 business days. We ask that you follow-up with your pharmacy.

## 2023-12-06 ENCOUNTER — Other Ambulatory Visit: Payer: Self-pay

## 2023-12-06 ENCOUNTER — Ambulatory Visit: Payer: Self-pay | Admitting: Family Medicine

## 2023-12-06 LAB — CBC WITH DIFFERENTIAL/PLATELET
Basophils Absolute: 0 x10E3/uL (ref 0.0–0.2)
Basos: 0 %
EOS (ABSOLUTE): 0.1 x10E3/uL (ref 0.0–0.4)
Eos: 1 %
Hematocrit: 38.7 % (ref 34.0–46.6)
Hemoglobin: 12.2 g/dL (ref 11.1–15.9)
Immature Grans (Abs): 0 x10E3/uL (ref 0.0–0.1)
Immature Granulocytes: 0 %
Lymphocytes Absolute: 2.1 x10E3/uL (ref 0.7–3.1)
Lymphs: 32 %
MCH: 27.7 pg (ref 26.6–33.0)
MCHC: 31.5 g/dL (ref 31.5–35.7)
MCV: 88 fL (ref 79–97)
Monocytes Absolute: 0.5 x10E3/uL (ref 0.1–0.9)
Monocytes: 7 %
Neutrophils Absolute: 4 x10E3/uL (ref 1.4–7.0)
Neutrophils: 60 %
Platelets: 268 x10E3/uL (ref 150–450)
RBC: 4.4 x10E6/uL (ref 3.77–5.28)
RDW: 13.8 % (ref 11.7–15.4)
WBC: 6.6 x10E3/uL (ref 3.4–10.8)

## 2023-12-06 LAB — CMP14+EGFR
ALT: 17 IU/L (ref 0–32)
AST: 20 IU/L (ref 0–40)
Albumin: 4.3 g/dL (ref 3.8–4.9)
Alkaline Phosphatase: 82 IU/L (ref 44–121)
BUN/Creatinine Ratio: 20 (ref 9–23)
BUN: 13 mg/dL (ref 6–24)
Bilirubin Total: 0.4 mg/dL (ref 0.0–1.2)
CO2: 25 mmol/L (ref 20–29)
Calcium: 8.9 mg/dL (ref 8.7–10.2)
Chloride: 99 mmol/L (ref 96–106)
Creatinine, Ser: 0.65 mg/dL (ref 0.57–1.00)
Globulin, Total: 2.9 g/dL (ref 1.5–4.5)
Glucose: 119 mg/dL — ABNORMAL HIGH (ref 70–99)
Potassium: 3.9 mmol/L (ref 3.5–5.2)
Sodium: 140 mmol/L (ref 134–144)
Total Protein: 7.2 g/dL (ref 6.0–8.5)
eGFR: 107 mL/min/1.73 (ref >59)

## 2023-12-06 LAB — VITAMIN D 25 HYDROXY (VIT D DEFICIENCY, FRACTURES): Vit D, 25-Hydroxy: 7.9 ng/mL — ABNORMAL LOW (ref 30.0–100.0)

## 2023-12-06 LAB — LIPID PANEL
Chol/HDL Ratio: 3.6 ratio (ref 0.0–4.4)
Cholesterol, Total: 199 mg/dL (ref 100–199)
HDL: 56 mg/dL (ref >39)
LDL Chol Calc (NIH): 124 mg/dL — ABNORMAL HIGH (ref 0–99)
Triglycerides: 104 mg/dL (ref 0–149)
VLDL Cholesterol Cal: 19 mg/dL (ref 5–40)

## 2023-12-06 LAB — HEMOGLOBIN A1C
Est. average glucose Bld gHb Est-mCnc: 160 mg/dL
Hgb A1c MFr Bld: 7.2 % — ABNORMAL HIGH (ref 4.8–5.6)

## 2023-12-06 LAB — TSH: TSH: 3.15 u[IU]/mL (ref 0.450–4.500)

## 2023-12-06 LAB — HEPATITIS C ANTIBODY: Hep C Virus Ab: NONREACTIVE

## 2023-12-06 MED ORDER — VITAMIN D (ERGOCALCIFEROL) 1.25 MG (50000 UNIT) PO CAPS
50000.0000 [IU] | ORAL_CAPSULE | ORAL | 0 refills | Status: AC
  Filled 2023-12-06: qty 12, 84d supply, fill #0

## 2023-12-06 NOTE — Progress Notes (Signed)
 Established Patient Office Visit  Subjective    Patient ID: Rebecca Stevenson, female    DOB: 1971-01-13  Age: 53 y.o. MRN: [id number]  CC:  Chief Complaint  Patient presents with   Annual Exam    Pt reports being in back pain     HPI Rebecca Stevenson presents for routine annual exam. Patient denies acute complaints.   Outpatient Encounter Medications as of 12/05/2023  Medication Sig   guaiFENesin  (MUCINEX ) 600 MG 12 hr tablet Take 2 tablets (1,200 mg total) by mouth 2 (two) times daily as needed.   [DISCONTINUED] albuterol  (VENTOLIN  HFA) 108 (90 Base) MCG/ACT inhaler Inhale 2 puffs into the lungs every 6 (six) hours as needed for wheezing or shortness of breath.   albuterol  (VENTOLIN  HFA) 108 (90 Base) MCG/ACT inhaler Inhale 2 puffs into the lungs every 6 (six) hours as needed for wheezing or shortness of breath.   cyclobenzaprine  (FLEXERIL ) 5 MG tablet Take 1 tablet (5 mg total) by mouth 2 (two) times daily as needed for muscle spasms.   doxycycline  (VIBRA -TABS) 100 MG tablet Take 1 tablet (100 mg total) by mouth 2 (two) times daily. (Patient not taking: Reported on 12/05/2023)   levocetirizine (XYZAL ) 5 MG tablet Take 1 tablet (5 mg total) by mouth every evening.   mirtazapine  (REMERON ) 15 MG tablet Take 1 tablet (15 mg total) by mouth at bedtime. (Patient not taking: Reported on 12/05/2023)   ondansetron  (ZOFRAN -ODT) 4 MG disintegrating tablet Take 1 tablet (4 mg total) by mouth every 8 (eight) hours as needed for nausea or vomiting. (Patient not taking: Reported on 12/05/2023)   predniSONE  (DELTASONE ) 20 MG tablet Take 2 tablets (40 mg total) by mouth daily with breakfast. (Patient not taking: Reported on 12/05/2023)   No facility-administered encounter medications on file as of 12/05/2023.    Past Medical History:  Diagnosis Date   Anxiety    Asthma    Hypertension    Insomnia    Muscle spasm    Prediabetes 2016   Hgb A1c of 6.0 in 2016   Speech impediment     Past  Surgical History:  Procedure Laterality Date   TUBAL LIGATION      Family History  Problem Relation Age of Onset   Asthma Mother    Diabetes Father    Hypertension Father    Heart disease Father    Stroke Father    Stuttering Father    Hypertension Sister    Cancer Sister    Diabetes Maternal Grandmother    Cancer Maternal Grandmother    Hypertension Maternal Grandfather    Hypertension Paternal Grandfather    Mental illness Daughter     Social History   Socioeconomic History   Marital status: Single    Spouse name: Not on file   Number of children: Not on file   Years of education: Not on file   Highest education level: Not on file  Occupational History   Not on file  Tobacco Use   Smoking status: Former   Smokeless tobacco: Never  Vaping Use   Vaping status: Never Used  Substance and Sexual Activity   Alcohol use: Yes    Alcohol/week: 0.0 standard drinks of alcohol    Comment: Occasional   Drug use: No    Types: Marijuana   Sexual activity: Yes    Birth control/protection: Condom  Other Topics Concern   Not on file  Social History Narrative   Lives with daughter and grandson  in a two story home.  Does not work.  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  Education: some college.    Social Drivers of Corporate investment banker Strain: Not on file  Food Insecurity: No Food Insecurity (12/05/2023)   Hunger Vital Sign    Worried About Running Out of Food in the Last Year: Never true    Ran Out of Food in the Last Year: Never true  Transportation Needs: Unmet Transportation Needs (12/05/2023)   PRAPARE - Administrator, Civil Service (Medical): Yes    Lack of Transportation (Non-Medical): Yes  Physical Activity: Not on file  Stress: Not on file  Social Connections: Not on file  Intimate Partner Violence: Not At Risk (12/05/2023)   Humiliation, Afraid, Rape, and Kick questionnaire    Fear of Current or Ex-Partner: No    Emotionally Abused: No     Physically Abused: No    Sexually Abused: No    Review of Systems  All other systems reviewed and are negative.       Objective    BP 131/85   Pulse 66   Ht 5' 3 (1.6 m)   Wt 248 lb 12.8 oz (112.9 kg)   LMP 06/05/2021 Comment: tubal ligation  SpO2 94%   BMI 44.07 kg/m   Physical Exam Vitals and nursing note reviewed.  Constitutional:      General: She is not in acute distress.    Appearance: She is obese.  HENT:     Head: Normocephalic and atraumatic.     Right Ear: Tympanic membrane, ear canal and external ear normal.     Left Ear: Tympanic membrane, ear canal and external ear normal.     Nose: Nose normal.     Mouth/Throat:     Mouth: Mucous membranes are moist.     Pharynx: Oropharynx is clear.  Eyes:     Conjunctiva/sclera: Conjunctivae normal.     Pupils: Pupils are equal, round, and reactive to light.  Neck:     Thyroid : No thyromegaly.  Cardiovascular:     Rate and Rhythm: Normal rate and regular rhythm.     Heart sounds: Normal heart sounds. No murmur heard. Pulmonary:     Effort: Pulmonary effort is normal. No respiratory distress.     Breath sounds: Normal breath sounds.  Abdominal:     General: There is no distension.     Palpations: Abdomen is soft. There is no mass.     Tenderness: There is no abdominal tenderness.  Musculoskeletal:        General: Normal range of motion.     Cervical back: Normal range of motion and neck supple.  Skin:    General: Skin is warm and dry.  Neurological:     General: No focal deficit present.     Mental Status: She is alert and oriented to person, place, and time.  Psychiatric:        Mood and Affect: Mood normal.        Behavior: Behavior normal.         Assessment & Plan:   Annual physical exam -     CMP14+EGFR  Screening for deficiency anemia -     CBC with Differential/Platelet  Screening for lipid disorders -     Lipid panel  Screening for endocrine/metabolic/immunity disorders -      VITAMIN D  25 Hydroxy (Vit-D Deficiency, Fractures) -     Hemoglobin A1c -  TSH  Encounter for screening mammogram for malignant neoplasm of breast -     3D Screening Mammogram, Left and Right; Future  Screening for colon cancer -     Cologuard  Need for hepatitis C screening test -     Hepatitis C antibody  Other orders -     Albuterol  Sulfate HFA; Inhale 2 puffs into the lungs every 6 (six) hours as needed for wheezing or shortness of breath.  Dispense: 6.7 g; Refill: 0     No follow-ups on file.   Tanda Raguel SQUIBB, MD

## 2023-12-09 ENCOUNTER — Other Ambulatory Visit: Payer: Self-pay

## 2023-12-09 MED ORDER — LEVOCETIRIZINE DIHYDROCHLORIDE 5 MG PO TABS
5.0000 mg | ORAL_TABLET | Freq: Every evening | ORAL | 2 refills | Status: DC
  Filled 2023-12-09: qty 30, 30d supply, fill #0

## 2023-12-09 MED ORDER — MIRTAZAPINE 15 MG PO TABS
15.0000 mg | ORAL_TABLET | Freq: Every day | ORAL | 1 refills | Status: DC
Start: 2023-12-09 — End: 2024-03-02
  Filled 2023-12-09: qty 30, 30d supply, fill #0

## 2023-12-09 NOTE — Telephone Encounter (Signed)
 Requested Prescriptions  Refused Prescriptions Disp Refills   albuterol  (VENTOLIN  HFA) 108 (90 Base) MCG/ACT inhaler 6.7 g 0    Sig: Inhale 2 puffs into the lungs every 6 (six) hours as needed for wheezing or shortness of breath.     Pulmonology:  Beta Agonists 2 Passed - 12/09/2023  3:18 PM      Passed - Last BP in normal range    BP Readings from Last 1 Encounters:  12/05/23 131/85         Passed - Last Heart Rate in normal range    Pulse Readings from Last 1 Encounters:  12/05/23 66         Passed - Valid encounter within last 12 months    Recent Outpatient Visits           4 days ago Annual physical exam   [place] [facility], Raguel, MD   3 months ago Chronic right-sided low back pain with right-sided sciatica   [place] [facility], MD   5 years ago Hypertension, unspecified type   [place] [facility] Dept Of [place]. [facility], [place], [place]   5 years ago Mild persistent asthma without complication   [place] [facility] [place] - A Dept Of [place]. [facility] Alec House, MD   9 years ago Left-sided weakness   [place] [facility] [place] - A Dept Of [place]. [facility] Colton Drape, MD       Future Appointments             In 1 month Tanda Raguel, MD [facility] at [facility]

## 2023-12-09 NOTE — Telephone Encounter (Signed)
 Requested medication (s) are due for refill today: yes  Requested medication (s) are on the active medication list: yes  Last refill:  06/20/23 #30  Future visit scheduled: yes  Notes to clinic:  last prescriber not at this practice   Requested Prescriptions  Pending Prescriptions Disp Refills   levocetirizine (XYZAL ) 5 MG tablet 30 tablet     Sig: Take 1 tablet (5 mg total) by mouth every evening.     Ear, Nose, and Throat:  Antihistamines - levocetirizine dihydrochloride  Passed - 12/09/2023  3:16 PM      Passed - Cr in normal range and within 360 days    Creat  Date Value Ref Range Status  05/21/2014 0.57 0.50 - 1.10 mg/dL Final   Creatinine, Ser  Date Value Ref Range Status  12/05/2023 0.65 0.57 - 1.00 mg/dL Final         Passed - eGFR is 10 or above and within 360 days    GFR, Est African American  Date Value Ref Range Status  05/21/2014 >89 mL/min Final   GFR calc Af Amer  Date Value Ref Range Status  03/25/2019 >60 >60 mL/min Final   GFR, Est Non African American  Date Value Ref Range Status  05/21/2014 >89 mL/min Final    Comment:      The estimated GFR is a calculation valid for adults (>=63 years old) that uses the CKD-EPI algorithm to adjust for age and sex. It is   not to be used for children, pregnant women, hospitalized patients,    patients on dialysis, or with rapidly changing kidney function. According to the NKDEP, eGFR >89 is normal, 60-89 shows mild impairment, 30-59 shows moderate impairment, 15-29 shows severe impairment and <15 is ESRD.      GFR calc non Af Amer  Date Value Ref Range Status  03/25/2019 >60 >60 mL/min Final   eGFR  Date Value Ref Range Status  12/05/2023 107 >59 mL/min/1.73 Final         Passed - Valid encounter within last 12 months    Recent Outpatient Visits           4 days ago Annual physical exam   [place] [facility], Raguel, MD   3 months ago Chronic right-sided low back pain  with right-sided sciatica   [place] [facility], MD   5 years ago Hypertension, unspecified type   [place] [facility] Of [place]. [facility] [place], [place], [place]   5 years ago Mild persistent asthma without complication   [place] [facility] [place] - A Dept Of [place]. [facility] Alec House, MD   9 years ago Left-sided weakness   [place] [facility] [place] - A Dept Of [place]. [facility] Colton Drape, MD       Future Appointments             In 1 month Tanda Raguel, MD [facility] at [facility]            Signed Prescriptions Disp Refills   mirtazapine  (REMERON ) 15 MG tablet 30 tablet 1    Sig: Take 1 tablet (15 mg total) by mouth at bedtime.     Psychiatry: Antidepressants - mirtazapine  Passed - 12/09/2023  3:16 PM      Passed - Valid encounter within last 6 months    Recent Outpatient  Visits           4 days ago Annual physical exam   [place] [facility], Raguel, MD   3 months ago Chronic right-sided low back pain with right-sided sciatica   [place] [facility], MD   5 years ago Hypertension, unspecified type   [place] [facility] Dept Of [place]. [facility], [place], [place]   5 years ago Mild persistent asthma without complication   [place] [facility] [place] - A Dept Of [place]. [facility] Alec House, MD   9 years ago Left-sided weakness    [facility] [place] - A Dept Of [place]. [facility] Colton Drape, MD       Future Appointments             In 1 month Tanda Raguel, MD [facility] at [facility]

## 2023-12-09 NOTE — Telephone Encounter (Signed)
 Requested Prescriptions  Pending Prescriptions Disp Refills   levocetirizine (XYZAL ) 5 MG tablet 30 tablet     Sig: Take 1 tablet (5 mg total) by mouth every evening.     Ear, Nose, and Throat:  Antihistamines - levocetirizine dihydrochloride  Passed - 12/09/2023  3:16 PM      Passed - Cr in normal range and within 360 days    Creat  Date Value Ref Range Status  05/21/2014 0.57 0.50 - 1.10 mg/dL Final   Creatinine, Ser  Date Value Ref Range Status  12/05/2023 0.65 0.57 - 1.00 mg/dL Final         Passed - eGFR is 10 or above and within 360 days    GFR, Est African American  Date Value Ref Range Status  05/21/2014 >89 mL/min Final   GFR calc Af Amer  Date Value Ref Range Status  03/25/2019 >60 >60 mL/min Final   GFR, Est Non African American  Date Value Ref Range Status  05/21/2014 >89 mL/min Final    Comment:      The estimated GFR is a calculation valid for adults (>=40 years old) that uses the CKD-EPI algorithm to adjust for age and sex. It is   not to be used for children, pregnant women, hospitalized patients,    patients on dialysis, or with rapidly changing kidney function. According to the NKDEP, eGFR >89 is normal, 60-89 shows mild impairment, 30-59 shows moderate impairment, 15-29 shows severe impairment and <15 is ESRD.      GFR calc non Af Amer  Date Value Ref Range Status  03/25/2019 >60 >60 mL/min Final   eGFR  Date Value Ref Range Status  12/05/2023 107 >59 mL/min/1.73 Final         Passed - Valid encounter within last 12 months    Recent Outpatient Visits           4 days ago Annual physical exam   [place] [facility], Raguel, MD   3 months ago Chronic right-sided low back pain with right-sided sciatica   [place] [facility], MD   5 years ago Hypertension, unspecified type   [place] [facility] A Dept Of [place]. [facility] [place], [place], [place]   5  years ago Mild persistent asthma without complication   [place] [facility] [place] - A Dept Of [place]. [facility] Alec House, MD   9 years ago Left-sided weakness   [place] [facility] [place] - A Dept Of [place]. [facility] Colton Drape, MD       Future Appointments             In 1 month Tanda Raguel, MD [facility] at [facility]             mirtazapine  (REMERON ) 15 MG tablet 30 tablet 1    Sig: Take 1 tablet (15 mg total) by mouth at bedtime.     Psychiatry: Antidepressants - mirtazapine  Passed - 12/09/2023  3:16 PM      Passed - Valid encounter within last 6 months    Recent Outpatient Visits           4 days ago Annual physical exam   [place] [facility], Raguel, MD   3 months ago Chronic right-sided low back pain with right-sided sciatica   [place] [facility]  [facility] Tanda Raguel, MD   5 years ago Hypertension, unspecified type   [place] [facility] - A Dept Of [place]. [facility] [place], [place], [place]   5 years ago Mild persistent asthma without complication   [place] [facility] [place] - A Dept Of [place]. [facility] Alec House, MD   9 years ago Left-sided weakness   [place] [facility] [place] - A Dept Of [place]. [facility] Colton Drape, MD       Future Appointments             In 1 month Tanda Raguel, MD [facility] at [facility]

## 2023-12-11 ENCOUNTER — Other Ambulatory Visit: Payer: Self-pay

## 2024-01-16 ENCOUNTER — Ambulatory Visit: Payer: Self-pay | Admitting: Family Medicine

## 2024-01-16 ENCOUNTER — Inpatient Hospital Stay: Admission: RE | Admit: 2024-01-16 | Source: Ambulatory Visit

## 2024-01-17 ENCOUNTER — Other Ambulatory Visit: Payer: Self-pay | Admitting: Family Medicine

## 2024-01-17 DIAGNOSIS — Z1231 Encounter for screening mammogram for malignant neoplasm of breast: Secondary | ICD-10-CM

## 2024-01-31 ENCOUNTER — Ambulatory Visit
Admission: RE | Admit: 2024-01-31 | Discharge: 2024-01-31 | Disposition: A | Discharge status: Home / Self Care | Payer: MEDICAID | Source: Ambulatory Visit

## 2024-01-31 ENCOUNTER — Other Ambulatory Visit: Payer: Self-pay

## 2024-01-31 ENCOUNTER — Telehealth: Admitting: Physician Assistant

## 2024-01-31 DIAGNOSIS — Z1231 Encounter for screening mammogram for malignant neoplasm of breast: Secondary | ICD-10-CM

## 2024-01-31 DIAGNOSIS — M5442 Lumbago with sciatica, left side: Secondary | ICD-10-CM

## 2024-01-31 MED ORDER — CYCLOBENZAPRINE HCL 10 MG PO TABS
5.0000 mg | ORAL_TABLET | Freq: Three times a day (TID) | ORAL | 0 refills | Status: DC | PRN
  Filled 2024-01-31: qty 30, 10d supply, fill #0

## 2024-01-31 MED ORDER — NAPROXEN 500 MG PO TABS
500.0000 mg | ORAL_TABLET | Freq: Two times a day (BID) | ORAL | 0 refills | Status: DC
  Filled 2024-01-31: qty 30, 15d supply, fill #0

## 2024-01-31 NOTE — Progress Notes (Signed)

## 2024-02-04 ENCOUNTER — Ambulatory Visit: Payer: Self-pay | Admitting: Family Medicine

## 2024-02-04 NOTE — Progress Notes (Signed)
 Patient reviewed lab results and provider recommendations via MyChart

## 2024-03-02 ENCOUNTER — Ambulatory Visit: Admitting: Family Medicine

## 2024-03-02 ENCOUNTER — Other Ambulatory Visit: Payer: Self-pay

## 2024-03-02 ENCOUNTER — Ambulatory Visit (INDEPENDENT_AMBULATORY_CARE_PROVIDER_SITE_OTHER): Admitting: Family Medicine

## 2024-03-02 ENCOUNTER — Other Ambulatory Visit (HOSPITAL_COMMUNITY): Payer: Self-pay

## 2024-03-02 ENCOUNTER — Encounter: Payer: Self-pay | Admitting: Family Medicine

## 2024-03-02 VITALS — BP 137/89 | HR 68 | Ht 63.0 in | Wt 254.4 lb

## 2024-03-02 DIAGNOSIS — M5442 Lumbago with sciatica, left side: Secondary | ICD-10-CM

## 2024-03-02 DIAGNOSIS — G8929 Other chronic pain: Secondary | ICD-10-CM

## 2024-03-02 DIAGNOSIS — J4520 Mild intermittent asthma, uncomplicated: Secondary | ICD-10-CM

## 2024-03-02 DIAGNOSIS — L989 Disorder of the skin and subcutaneous tissue, unspecified: Secondary | ICD-10-CM

## 2024-03-02 DIAGNOSIS — F32A Depression, unspecified: Secondary | ICD-10-CM

## 2024-03-02 DIAGNOSIS — F419 Anxiety disorder, unspecified: Secondary | ICD-10-CM

## 2024-03-02 MED ORDER — LEVOCETIRIZINE DIHYDROCHLORIDE 5 MG PO TABS
5.0000 mg | ORAL_TABLET | Freq: Every evening | ORAL | 2 refills | Status: AC
  Filled 2024-03-02 (×2): qty 30, 30d supply, fill #0
  Filled 2024-04-26 – 2024-05-06 (×3): qty 30, 30d supply, fill #1

## 2024-03-02 MED ORDER — ALBUTEROL SULFATE HFA 108 (90 BASE) MCG/ACT IN AERS
2.0000 | INHALATION_SPRAY | Freq: Four times a day (QID) | RESPIRATORY_TRACT | 2 refills | Status: AC | PRN
  Filled 2024-03-02 (×2): qty 6.7, 25d supply, fill #0
  Filled 2024-04-26 – 2024-05-06 (×3): qty 6.7, 25d supply, fill #1

## 2024-03-02 MED ORDER — MIRTAZAPINE 15 MG PO TABS
15.0000 mg | ORAL_TABLET | Freq: Every day | ORAL | 3 refills | Status: AC
  Filled 2024-03-02 (×2): qty 30, 30d supply, fill #0
  Filled 2024-04-26 – 2024-05-06 (×3): qty 30, 30d supply, fill #1

## 2024-03-02 MED ORDER — NAPROXEN 500 MG PO TABS
500.0000 mg | ORAL_TABLET | Freq: Two times a day (BID) | ORAL | 0 refills | Status: DC
  Filled 2024-03-02 (×2): qty 30, 15d supply, fill #0

## 2024-03-02 MED ORDER — CYCLOBENZAPRINE HCL 10 MG PO TABS
5.0000 mg | ORAL_TABLET | Freq: Three times a day (TID) | ORAL | 0 refills | Status: DC | PRN
  Filled 2024-03-02 (×2): qty 30, 10d supply, fill #0

## 2024-03-04 ENCOUNTER — Encounter: Payer: Self-pay | Admitting: Family Medicine

## 2024-03-04 NOTE — Progress Notes (Addendum)
 Established Patient Office Visit  Subjective    Patient ID: Rebecca Stevenson, female    DOB: December 04, 1970  Age: 53 y.o. MRN: [id number]  CC:  Chief Complaint  Patient presents with   Sciatica pain    Sciatica pain traveling down legs     HPI Rebecca Stevenson presents for follow up of persistent lower back pain and depression. Patient denies known trauma or injury. Patient reports med compliance and denies acute complaints.   Outpatient Encounter Medications as of 03/02/2024  Medication Sig   Vitamin D , Ergocalciferol , (DRISDOL ) 1.25 MG (50000 UNIT) CAPS capsule Take 1 capsule (50,000 Units total) by mouth every 7 (seven) days.   [DISCONTINUED] albuterol  (VENTOLIN  HFA) 108 (90 Base) MCG/ACT inhaler Inhale 2 puffs into the lungs every 6 (six) hours as needed for wheezing or shortness of breath.   [DISCONTINUED] cyclobenzaprine  (FLEXERIL ) 10 MG tablet Take 0.5-1 tablets (5-10 mg total) by mouth 3 (three) times daily as needed.   albuterol  (VENTOLIN  HFA) 108 (90 Base) MCG/ACT inhaler Inhale 2 puffs into the lungs every 6 (six) hours as needed for wheezing or shortness of breath.   cyclobenzaprine  (FLEXERIL ) 10 MG tablet Take 0.5-1 tablets (5-10 mg total) by mouth 3 (three) times daily as needed.   guaiFENesin  (MUCINEX ) 600 MG 12 hr tablet Take 2 tablets (1,200 mg total) by mouth 2 (two) times daily as needed. (Patient not taking: Reported on 03/02/2024)   levocetirizine (XYZAL ) 5 MG tablet Take 1 tablet (5 mg total) by mouth every evening.   mirtazapine  (REMERON ) 15 MG tablet Take 1 tablet (15 mg total) by mouth at bedtime.   naproxen  (NAPROSYN ) 500 MG tablet Take 1 tablet (500 mg total) by mouth 2 (two) times daily with a meal.   [DISCONTINUED] levocetirizine (XYZAL ) 5 MG tablet Take 1 tablet (5 mg total) by mouth every evening.   [DISCONTINUED] mirtazapine  (REMERON ) 15 MG tablet Take 1 tablet (15 mg total) by mouth at bedtime. (Patient not taking: Reported on 03/02/2024)    [DISCONTINUED] naproxen  (NAPROSYN ) 500 MG tablet Take 1 tablet (500 mg total) by mouth 2 (two) times daily with a meal.   No facility-administered encounter medications on file as of 03/02/2024.    Past Medical History:  Diagnosis Date   Anxiety    Asthma    Hypertension    Insomnia    Muscle spasm    Prediabetes 2016   Hgb A1c of 6.0 in 2016   Speech impediment     Past Surgical History:  Procedure Laterality Date   TUBAL LIGATION      Family History  Problem Relation Age of Onset   Asthma Mother    Diabetes Father    Hypertension Father    Heart disease Father    Stroke Father    Stuttering Father    Hypertension Sister    Cancer Sister    Mental illness Daughter    Diabetes Maternal Grandmother    Cancer Maternal Grandmother    Hypertension Maternal Grandfather    Hypertension Paternal Grandfather    Breast cancer Neg Hx     Social History   Socioeconomic History   Marital status: Single    Spouse name: Not on file   Number of children: Not on file   Years of education: Not on file   Highest education level: Not on file  Occupational History   Not on file  Tobacco Use   Smoking status: Former   Smokeless tobacco: Never  Advertising Account Planner  Vaping status: Never Used  Substance and Sexual Activity   Alcohol use: Yes    Alcohol/week: 0.0 standard drinks of alcohol    Comment: Occasional   Drug use: No    Types: Marijuana   Sexual activity: Yes    Birth control/protection: Condom  Other Topics Concern   Not on file  Social History Narrative   Lives with daughter and grandson in a two story home.  Does not work.  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  Education: some college.    Social Drivers of Health   Financial Resource Strain: Medium Risk (03/02/2024)   Overall Financial Resource Strain (CARDIA)    Difficulty of Paying Living Expenses: Somewhat hard  Food Insecurity: No Food Insecurity (03/02/2024)   Hunger Vital Sign    Worried About Running  Out of Food in the Last Year: Never true    Ran Out of Food in the Last Year: Never true  Transportation Needs: Unmet Transportation Needs (03/02/2024)   PRAPARE - Administrator, Civil Service (Medical): Yes    Lack of Transportation (Non-Medical): No  Physical Activity: Insufficiently Active (03/02/2024)   Exercise Vital Sign    Days of Exercise per Week: 3 days    Minutes of Exercise per Session: 20 min  Stress: Stress Concern Present (03/02/2024)   [employer] of Occupational Health - Occupational Stress Questionnaire    Feeling of Stress: Rather much  Social Connections: Socially Isolated (03/02/2024)   Social Connection and Isolation Panel    Frequency of Communication with Friends and Family: More than three times a week    Frequency of Social Gatherings with Friends and Family: Once a week    Attends Religious Services: Never    Database Administrator or Organizations: No    Attends Banker Meetings: Never    Marital Status: Never married  Intimate Partner Violence: Not At Risk (03/02/2024)   Humiliation, Afraid, Rape, and Kick questionnaire    Fear of Current or Ex-Partner: No    Emotionally Abused: No    Physically Abused: No    Sexually Abused: No    Review of Systems  Musculoskeletal:  Positive for back pain.  All other systems reviewed and are negative.       Objective    BP 137/89   Pulse 68   Ht 5' 3 (1.6 m)   Wt 254 lb 6.4 oz (115.4 kg)   LMP 06/05/2021 Comment: tubal ligation  SpO2 98%   BMI 45.06 kg/m   Physical Exam Vitals and nursing note reviewed.  Constitutional:      General: She is not in acute distress. Cardiovascular:     Rate and Rhythm: Normal rate and regular rhythm.  Pulmonary:     Effort: Pulmonary effort is normal.     Breath sounds: Normal breath sounds.  Abdominal:     Palpations: Abdomen is soft.     Tenderness: There is no abdominal tenderness.  Musculoskeletal:     Lumbar back: Spasms and  tenderness present. No swelling or deformity. Decreased range of motion.  Neurological:     General: No focal deficit present.     Mental Status: She is alert and oriented to person, place, and time.  Psychiatric:        Mood and Affect: Mood is anxious.         Assessment & Plan:   Chronic left-sided low back pain with left-sided sciatica -  Naproxen ; Take 1 tablet (500 mg total) by mouth 2 (two) times daily with a meal.  Dispense: 30 tablet; Refill: 0 -     Cyclobenzaprine  HCl; Take 0.5-1 tablets (5-10 mg total) by mouth 3 (three) times daily as needed.  Dispense: 30 tablet; Refill: 0 -     Ambulatory referral to Physical Therapy  Mild intermittent asthma in adult without complication -     Levocetirizine Dihydrochloride ; Take 1 tablet (5 mg total) by mouth every evening.  Dispense: 30 tablet; Refill: 2  Anxiety and depression  Skin lesion of left arm  Other orders -     Albuterol  Sulfate HFA; Inhale 2 puffs into the lungs every 6 (six) hours as needed for wheezing or shortness of breath.  Dispense: 6.7 g; Refill: 2 -     Mirtazapine ; Take 1 tablet (15 mg total) by mouth at bedtime.  Dispense: 30 tablet; Refill: 3     Return in about 3 months (around 06/02/2024) for follow up, chronic med issues.   Tanda Raguel SQUIBB, MD

## 2024-03-11 ENCOUNTER — Ambulatory Visit

## 2024-03-11 LAB — COLOGUARD

## 2024-04-27 ENCOUNTER — Other Ambulatory Visit: Payer: Self-pay

## 2024-04-27 ENCOUNTER — Encounter: Payer: Self-pay | Admitting: Pharmacist

## 2024-04-30 ENCOUNTER — Other Ambulatory Visit: Payer: Self-pay

## 2024-05-01 ENCOUNTER — Other Ambulatory Visit: Payer: Self-pay | Admitting: Family Medicine

## 2024-05-01 ENCOUNTER — Other Ambulatory Visit: Payer: Self-pay

## 2024-05-01 DIAGNOSIS — G8929 Other chronic pain: Secondary | ICD-10-CM

## 2024-05-06 ENCOUNTER — Other Ambulatory Visit: Payer: Self-pay

## 2024-05-06 ENCOUNTER — Other Ambulatory Visit (HOSPITAL_COMMUNITY): Payer: Self-pay

## 2024-05-06 ENCOUNTER — Telehealth: Payer: Self-pay

## 2024-05-06 NOTE — Telephone Encounter (Signed)
 Copied from CRM [id number]. Topic: Clinical - Prescription Issue >> May 06, 2024  4:00 PM Hadassah PARAS wrote: Reason for CRM: Pt is fu on med req that was input Jan 9th and is still awating approval. Please advise pt on #[id number]

## 2024-05-07 ENCOUNTER — Other Ambulatory Visit: Payer: Self-pay

## 2024-05-07 MED ORDER — NAPROXEN 500 MG PO TABS
500.0000 mg | ORAL_TABLET | Freq: Two times a day (BID) | ORAL | 0 refills | Status: AC
  Filled 2024-05-07 – 2024-05-17 (×3): qty 30, 15d supply, fill #0

## 2024-05-07 MED ORDER — CYCLOBENZAPRINE HCL 10 MG PO TABS
5.0000 mg | ORAL_TABLET | Freq: Three times a day (TID) | ORAL | 0 refills | Status: AC | PRN
  Filled 2024-05-07 – 2024-05-17 (×3): qty 30, 10d supply, fill #0

## 2024-05-17 ENCOUNTER — Other Ambulatory Visit (HOSPITAL_COMMUNITY): Payer: Self-pay

## 2024-05-18 ENCOUNTER — Other Ambulatory Visit: Payer: Self-pay

## 2024-05-20 ENCOUNTER — Other Ambulatory Visit: Payer: Self-pay

## 2024-06-02 ENCOUNTER — Ambulatory Visit: Admitting: Family Medicine

## 2024-07-14 ENCOUNTER — Ambulatory Visit: Admitting: Family Medicine
